# Patient Record
Sex: Female | Born: 1995
Health system: Southern US, Community
[De-identification: ages and names within clinical notes are randomized; demographics above are authoritative.]

## PROBLEM LIST (undated history)

## (undated) DIAGNOSIS — K649 Unspecified hemorrhoids: Secondary | ICD-10-CM

## (undated) DIAGNOSIS — F419 Anxiety disorder, unspecified: Secondary | ICD-10-CM

## (undated) DIAGNOSIS — R87610 Atypical squamous cells of undetermined significance on cytologic smear of cervix (ASC-US): Secondary | ICD-10-CM

## (undated) DIAGNOSIS — R87810 Cervical high risk human papillomavirus (HPV) DNA test positive: Secondary | ICD-10-CM

## (undated) DIAGNOSIS — E559 Vitamin D deficiency, unspecified: Secondary | ICD-10-CM

## (undated) HISTORY — DX: Unspecified hemorrhoids: K64.9

## (undated) HISTORY — PX: NO PAST SURGERIES: SHX2092

## (undated) HISTORY — DX: Anxiety disorder, unspecified: F41.9

## (undated) HISTORY — DX: Vitamin D deficiency, unspecified: E55.9

## (undated) HISTORY — DX: Cervical high risk human papillomavirus (HPV) DNA test positive: R87.810

## (undated) HISTORY — DX: Atypical squamous cells of undetermined significance on cytologic smear of cervix (ASC-US): R87.610

---
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
## 2016-11-28 DIAGNOSIS — R87610 Atypical squamous cells of undetermined significance on cytologic smear of cervix (ASC-US): Secondary | ICD-10-CM

## 2016-11-28 HISTORY — DX: Atypical squamous cells of undetermined significance on cytologic smear of cervix (ASC-US): R87.610

## 2017-11-22 ENCOUNTER — Ambulatory Visit
Admission: EM | Admit: 2017-11-22 | Discharge: 2017-11-22 | Disposition: A | Discharge status: Other Acute Inpatient Hospital | Payer: BLUE CROSS/BLUE SHIELD | Attending: Emergency Medicine | Admitting: Emergency Medicine

## 2017-11-22 ENCOUNTER — Encounter: Payer: Self-pay | Admitting: *Deleted

## 2017-11-22 ENCOUNTER — Other Ambulatory Visit: Payer: Self-pay

## 2017-11-22 DIAGNOSIS — R112 Nausea with vomiting, unspecified: Secondary | ICD-10-CM

## 2017-11-22 DIAGNOSIS — R1033 Periumbilical pain: Secondary | ICD-10-CM

## 2017-11-22 DIAGNOSIS — R197 Diarrhea, unspecified: Secondary | ICD-10-CM

## 2017-11-22 NOTE — ED Provider Notes (Signed)
HPI  SUBJECTIVE:  Kathy Romero is a 21 y.o. female who presents with nonmigratory, nonradiating, stabbing, intermittent, minutes long bilateral pelvic/lower abdominal pain for 3 days, anorexia, nausea, and watery diarrhea.  She states that she ate a raw hamburger on 12/23 and approximately 8-10 hours later, she started having multiple episodes of emesis.  She had an E visit, was prescribed Zofran which worked temporarily.  She was then seen in the [facility] ER 2 days ago for right lower quadrant pain starting earlier that day.  CBC, CMP, lipase, UA and pregnancy were all normal.  Abdomen was benign.  She declined a pelvic exam that day.  The ER held off on CT imaging on that visit, but advised patient to return for scan if she did not get better in the next 24 hours or if she got worse.  She states that the vomiting is slowed down and she is now tolerating fluids, but has started to have watery, nonbloody diarrhea.  She has not taken any antiemetics in the past 24 hours.  States that the pain has not gotten any worse, but it is not gotten any better.  She has tried Pepto-Bismol, Aleve.  Symptoms are slightly better after she has a bowel movement, worse with eating, movement, walking.  She states that the car ride over here was painful.  She denies fevers, abdominal distention, urinary complaints.  She reports decreased urine output.  No vaginal bleeding, discharge, odor, genital rash or itching.  She has not been sexually active in a month.  She states that she has a single relatively new female sexual  partner.  She reports occasional alcohol use.  Denies drinking heavily recently.  No antibiotics in the past month.  No antipyretic in the past 6-8 hours.  No recent travel, camping, pending disease, exposure to chickens, reptiles.  No past medical history of abdominal surgeries, diabetes, hypertension, pancreatitis, appendicitis, gallbladder disease, gonorrhea, chlamydia, HIV, HSV, syphilis, Trichomonas, BV, PID,  ovarian cyst.  LMP: 2 weeks ago.  She has a Nexplanon.  PMD: None.  History reviewed. No pertinent past medical history.  History reviewed. No pertinent surgical history.  History reviewed. No pertinent family history.  Social History   Tobacco Use  . Smoking status: Current Every Day Smoker    Packs/day: 0.50    Types: Cigarettes  . Smokeless tobacco: Never Used  Substance Use Topics  . Alcohol use: Yes    Frequency: Never  . Drug use: No    No current facility-administered medications for this encounter.   Current Outpatient Medications:  .  ondansetron (ZOFRAN) 4 MG tablet, Take 4 mg by mouth every 8 (eight) hours as needed for nausea or vomiting., Disp: , Rfl:   No Known Allergies   ROS  As noted in HPI.   Physical Exam  BP 120/70 (BP Location: Left Arm)   Pulse 91   Temp 98.6 F (37 C) (Oral)   Resp 16   Ht 5\' 2"  (1.575 m)   Wt 186 lb (84.4 kg)   SpO2 99%   BMI 34.02 kg/m   Constitutional: Well developed, well nourished, no acute distress Eyes:  EOMI, conjunctiva normal bilaterally HENT: Normocephalic, atraumatic,mucus membranes moist Respiratory: Normal inspiratory effort Cardiovascular: Normal rate GI: nondistended.  Normal appearance.  Soft, non-distended.  Positive periumbilical tenderness.  Negative Murphy, negative McBurney.  Active bowel sounds, no guarding, rebound.  Negative tap table test Back: No CVAT Skin: No rash, skin intact Musculoskeletal: no deformities Neurologic: Alert &  oriented x 3, no focal neuro deficits Psychiatric: Speech and behavior appropriate   ED Course   Medications - No data to display  No orders of the defined types were placed in this encounter.   No results found for this or any previous visit (from the past 24 hour(s)). No results found.  ED Clinical Impression  Periumbilical abdominal pain  Nausea vomiting and diarrhea   ED Assessment/Plan  Outside records reviewed as noted in HPI.  Given that  the patient's pain has not improved, still concern for appendicitis or other surgical abdomen.  In the differential is infectious colitis.  If this was simply gastroenteritis, I would expect the patient's pain to be getting better by now.  Doubt GYN cause of her pain as it is periumbilical on exam, atlhough this is in the differential.  Had extensive discussion with the patient that we could repeat the labs done in the ER, but feel that she requires imaging that is not available at this urgent care center.  Stool sample may also be beneficial.  Patient declined pain or nausea medicine prior to leaving.  Feel that she is stable to go by private vehicle.  Discussed with the charge nurse at [facility].    No orders of the defined types were placed in this encounter.   *This clinic note was created using Dragon dictation software. Therefore, there may be occasional mistakes despite careful proofreading.   ?   Domenick GongMortenson, Hridaan Bouse, MD 11/22/17 1352

## 2017-11-22 NOTE — ED Triage Notes (Signed)
PAtient started having severe nausea and vomiting 2 days ago after eating a hamburger that didn't taste right. [facility] vomiting. Patient continues to have symptoms of nausea and vomiting with diarrhea starting today.

## 2017-11-28 DIAGNOSIS — K649 Unspecified hemorrhoids: Secondary | ICD-10-CM

## 2017-11-28 HISTORY — DX: Unspecified hemorrhoids: K64.9

## 2017-12-18 ENCOUNTER — Encounter: Payer: Self-pay | Admitting: Obstetrics and Gynecology

## 2017-12-18 ENCOUNTER — Ambulatory Visit (INDEPENDENT_AMBULATORY_CARE_PROVIDER_SITE_OTHER): Payer: BLUE CROSS/BLUE SHIELD | Admitting: Obstetrics and Gynecology

## 2017-12-18 VITALS — BP 120/80 | Ht 62.0 in | Wt 172.0 lb

## 2017-12-18 DIAGNOSIS — Z3046 Encounter for surveillance of implantable subdermal contraceptive: Secondary | ICD-10-CM | POA: Diagnosis not present

## 2017-12-18 DIAGNOSIS — Z01419 Encounter for gynecological examination (general) (routine) without abnormal findings: Secondary | ICD-10-CM | POA: Diagnosis not present

## 2017-12-18 DIAGNOSIS — Z124 Encounter for screening for malignant neoplasm of cervix: Secondary | ICD-10-CM | POA: Diagnosis not present

## 2017-12-18 DIAGNOSIS — Z113 Encounter for screening for infections with a predominantly sexual mode of transmission: Secondary | ICD-10-CM | POA: Diagnosis not present

## 2017-12-18 NOTE — Progress Notes (Addendum)
PCP:  Patient, No Pcp Per   Chief Complaint  Patient presents with  . Gynecologic Exam     HPI:      Kathy Romero is a 22 y.o. No obstetric history on file. who LMP was Patient's last menstrual period was 12/15/2017., presents today for her NP annual examination.  Her menses are irregular with nexplanon. Dysmenorrhea none. She does not have intermenstrual bleeding.  Sex activity: single partner, contraception - nexplanon. Placed 04/2016. Hx of STDs: none  There is no FH of breast cancer. There is no FH of ovarian cancer. The patient does not do self-breast exams.  Tobacco use: The patient currently smokes 1/4 packs of cigarettes per day for the past few years. Thinking about quitting.  Alcohol use: none No drug use.  Exercise: moderately active  She does get adequate calcium and Vitamin D in her diet.  Gardasil not completed.    History reviewed. No pertinent past medical history.  History reviewed. No pertinent surgical history.  Family History  Problem Relation Age of Onset  . Hypertension Mother   . Diabetes Mother   . Hypertension Father   . Diabetes Father     Social History   Socioeconomic History  . Marital status: Single    Spouse name: Not on file  . Number of children: Not on file  . Years of education: Not on file  . Highest education level: Not on file  Social Needs  . Financial resource strain: Not on file  . Food insecurity - worry: Not on file  . Food insecurity - inability: Not on file  . Transportation needs - medical: Not on file  . Transportation needs - non-medical: Not on file  Occupational History  . Not on file  Tobacco Use  . Smoking status: Current Every Day Smoker    Packs/day: 0.50    Types: Cigarettes  . Smokeless tobacco: Never Used  Substance and Sexual Activity  . Alcohol use: Yes    Frequency: Never  . Drug use: No  . Sexual activity: Yes    Birth control/protection: Implant  Other Topics Concern  . Not  on file  Social History Narrative  . Not on file    No outpatient medications have been marked as taking for the 12/18/17 encounter (Office Visit) with Lanaiya Lantry B, PA-C.     ROS:  Review of Systems  Constitutional: Positive for fatigue. Negative for fever and unexpected weight change.  Respiratory: Negative for cough, shortness of breath and wheezing.   Cardiovascular: Negative for chest pain, palpitations and leg swelling.  Gastrointestinal: Negative for blood in stool, constipation, diarrhea, nausea and vomiting.  Endocrine: Negative for cold intolerance, heat intolerance and polyuria.  Genitourinary: Positive for vaginal discharge. Negative for dyspareunia, dysuria, flank pain, frequency, genital sores, hematuria, menstrual problem, pelvic pain, urgency, vaginal bleeding and vaginal pain.  Musculoskeletal: Positive for arthralgias. Negative for back pain, joint swelling and myalgias.  Skin: Negative for rash.  Neurological: Positive for headaches. Negative for dizziness, syncope, light-headedness and numbness.  Hematological: Negative for adenopathy.  Psychiatric/Behavioral: Negative for agitation, confusion, sleep disturbance and suicidal ideas. The patient is not nervous/anxious.      Objective: BP 120/80   Ht 5\' 2"  (1.575 m)   Wt 172 lb (78 kg)   LMP 12/15/2017   BMI 31.46 kg/m    Physical Exam  Constitutional: She is oriented to person, place, and time. She appears well-developed and well-nourished.  Genitourinary: Vagina normal and  uterus normal. There is no rash or tenderness on the right labia. There is no rash or tenderness on the left labia. No erythema or tenderness in the vagina. No vaginal discharge found. Right adnexum does not display mass and does not display tenderness. Left adnexum does not display mass and does not display tenderness. Cervix does not exhibit motion tenderness or polyp. Uterus is not enlarged or tender.  Neck: Normal range of motion. No  thyromegaly present.  Cardiovascular: Normal rate, regular rhythm and normal heart sounds.  No murmur heard. Pulmonary/Chest: Effort normal and breath sounds normal. Right breast exhibits no mass, no nipple discharge, no skin change and no tenderness. Left breast exhibits no mass, no nipple discharge, no skin change and no tenderness.  Abdominal: Soft. There is no tenderness. There is no guarding.  Musculoskeletal: Normal range of motion.  Neurological: She is alert and oriented to person, place, and time. No cranial nerve deficit.  Psychiatric: She has a normal mood and affect. Her behavior is normal.  Vitals reviewed.  Assessment/Plan: Encounter for annual routine gynecological examination  Cervical cancer screening - Plan: IGP,CtNgTv,rfx Aptima HPV ASCU  Screening for STD (sexually transmitted disease) - Plan: IGP,CtNgTv,rfx Aptima HPV ASCU  Encounter for surveillance of implantable subdermal contraceptive - Due for removal 04/2019.       GYN counsel adequate intake of calcium and vitamin D, diet and exercise, Gardasil vaccine (handout given), tobacco cessation     F/U  Return in about 1 year (around 12/18/2018).  Sherea Liptak B. Zaylia Riolo, PA-C 12/18/2017 2:11 PM

## 2017-12-18 NOTE — Patient Instructions (Signed)
I value your feedback and entrusting us with your care. If you get a  patient survey, I would appreciate you taking the time to let us know about your experience today. Thank you! 

## 2017-12-25 ENCOUNTER — Encounter: Payer: Self-pay | Admitting: Obstetrics and Gynecology

## 2017-12-25 LAB — IGP,CTNGTV,RFX APTIMA HPV ASCU
Chlamydia, Nuc. Acid Amp: NEGATIVE
Gonococcus, Nuc. Acid Amp: NEGATIVE
PAP Smear Comment: 0
Trich vag by NAA: NEGATIVE

## 2017-12-25 LAB — HPV APTIMA: HPV Aptima: POSITIVE — AB

## 2018-09-04 ENCOUNTER — Ambulatory Visit
Admission: EM | Admit: 2018-09-04 | Discharge: 2018-09-04 | Disposition: A | Discharge status: Home / Self Care | Payer: BLUE CROSS/BLUE SHIELD | Attending: Family Medicine | Admitting: Family Medicine

## 2018-09-04 ENCOUNTER — Encounter: Payer: Self-pay | Admitting: Emergency Medicine

## 2018-09-04 ENCOUNTER — Other Ambulatory Visit: Payer: Self-pay

## 2018-09-04 DIAGNOSIS — J069 Acute upper respiratory infection, unspecified: Secondary | ICD-10-CM

## 2018-09-04 MED ORDER — BENZONATATE 200 MG PO CAPS: ORAL_CAPSULE | ORAL | 0 refills | Status: DC

## 2018-09-04 MED ORDER — FLUTICASONE PROPIONATE 50 MCG/ACT NA SUSP: 2.0000 | Freq: Every day | NASAL | 0 refills | Status: DC

## 2018-09-04 NOTE — Discharge Instructions (Addendum)
Drink plenty of fluids rest as much as possible.  Gargle with warm salt water as we discussed. Hope you are feeling well soon

## 2018-09-04 NOTE — ED Provider Notes (Signed)
[facility]    CSN: [id number] Arrival date & time: 09/04/18  1226     History   Chief Complaint Chief Complaint  Patient presents with  . URI    HPI Kathy Romero is a 22 y.o. female.   HPI 22 year old female presents with 3-day history of sneezing coughing nasal congestion and sore throat.  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  Denies any fever or chills.  Tried over-the-counter remedies without success.       History reviewed. No pertinent past medical history.  There are no active problems to display for this patient.   Past Surgical History:  Procedure Laterality Date  . NO PAST SURGERIES      OB History    Gravida  0   Para  0   Term  0   Preterm  0   AB  0   Living  0     SAB  0   TAB  0   Ectopic  0   Multiple  0   Live Births  0            Home Medications    Prior to Admission medications   Medication Sig Start Date End Date Taking? Authorizing Provider  etonogestrel (IMPLANON) 68 MG IMPL implant Inject into the skin.   Yes [provider]  benzonatate (TESSALON) 200 MG capsule Take one cap TID PRN cough 09/04/18   Lutricia Feil, PA-C  fluticasone  [facility]) 50 MCG/ACT nasal spray Place 2 sprays into both nostrils daily. 09/04/18   Lutricia Feil, PA-C    Family History Family History  Problem Relation Age of Onset  . Hypertension Mother   . Diabetes Mother   . Hypertension Father   . Diabetes Father     Social History Social History   Tobacco Use  . Smoking status: Current Every Day Smoker    Packs/day: 0.50    Types: Cigarettes  . Smokeless tobacco: Never Used  Substance Use Topics  . Alcohol use: Yes    Frequency: Never  . Drug use: No     Allergies   Patient has no known allergies.   Review of Systems Review of Systems  Constitutional: Positive for activity change. Negative for appetite change, chills, fatigue and fever.  HENT:  Positive for sneezing and sore throat.   Respiratory: Positive for cough. Negative for shortness of breath.   All other systems reviewed and are negative.    Physical Exam Triage Vital Signs ED Triage Vitals  Enc Vitals Group     BP 09/04/18 1242 118/74     Pulse Rate 09/04/18 1242 65     Resp 09/04/18 1242 17     Temp 09/04/18 1242 98.8 F (37.1 C)     Temp Source 09/04/18 1242 Oral     SpO2 09/04/18 1242 100 %     Weight 09/04/18 1239 165 lb (74.8 kg)     Height 09/04/18 1239 5\' 2"  (1.575 m)     Head Circumference --      Peak Flow --      Pain Score 09/04/18 1239 5     Pain Loc --      Pain Edu? --      Excl. in GC? --    No data found.  Updated Vital Signs BP 118/74 (BP Location: Left Arm)   Pulse 65   Temp 98.8 F (37.1 C) (Oral)   Resp  17   Ht 5\' 2"  (1.575 m)   Wt 165 lb (74.8 kg)   SpO2 100%   BMI 30.18 kg/m   Visual Acuity Right Eye Distance:   Left Eye Distance:   Bilateral Distance:    Right Eye Near:   Left Eye Near:    Bilateral Near:     Physical Exam  Constitutional: She is oriented to person, place, and time. She appears well-developed and well-nourished. No distress.  HENT:  Head: Normocephalic.  Eyes: Pupils are equal, round, and reactive to light. Right eye exhibits no discharge. Left eye exhibits no discharge.  Neck: Normal range of motion.  Pulmonary/Chest: Effort normal and breath sounds normal.  Musculoskeletal: Normal range of motion.  Neurological: She is alert and oriented to person, place, and time.  Skin: Skin is warm and dry. She is not diaphoretic.  Psychiatric: She has a normal mood and affect. Her behavior is normal. Judgment and thought content normal.  Nursing note and vitals reviewed.    UC Treatments / Results  Labs (all labs ordered are listed, but only abnormal results are displayed) Labs Reviewed - No data to display  EKG None  Radiology No results found.  Procedures Procedures (including critical care  time)  Medications Ordered in UC Medications - No data to display  Initial Impression / Assessment and Plan / UC Course  I have reviewed the triage vital signs and the nursing notes.  Pertinent labs & imaging results that were available during my care of the patient were reviewed by me and considered in my medical decision making (see chart for details).     I have told patient she has an upper respiratory infection which is viral and does not require antibiotics.  Treat her symptomatically with Tessalon Perles for cough suppression and Flonase for nasal congestion.  Likely run its course.  She could also use Tylenol or Motrin for any body aches or fever.  If she is not improving recommend she follow-up with her primary care physician or she may return to our clinic Final Clinical Impressions(s) / UC Diagnoses   Final diagnoses:  Upper respiratory tract infection, unspecified type     Discharge Instructions     Drink plenty of fluids rest as much as possible.  Gargle with warm salt water as we discussed. Hope you are feeling well soon   ED Prescriptions    Medication Sig Dispense Auth. Provider   benzonatate (TESSALON) 200 MG capsule Take one cap TID PRN cough 30 capsule Ovid Curd P, PA-C   fluticasone (FLONASE) 50 MCG/ACT nasal spray Place 2 sprays into both nostrils daily. 16 g Lutricia Feil, PA-C     Controlled Substance Prescriptions [place] Controlled Substance Registry consulted? Not Applicable   Lutricia Feil, PA-C 09/04/18 1818

## 2018-09-04 NOTE — ED Triage Notes (Signed)
Pt c/o sneezing, cough, nasal congestion, and sore throat. Started about 3 days ago.

## 2018-10-11 ENCOUNTER — Encounter: Payer: Self-pay | Admitting: Obstetrics and Gynecology

## 2018-10-11 ENCOUNTER — Ambulatory Visit: Payer: BLUE CROSS/BLUE SHIELD | Admitting: Obstetrics and Gynecology

## 2018-10-11 ENCOUNTER — Ambulatory Visit (INDEPENDENT_AMBULATORY_CARE_PROVIDER_SITE_OTHER): Payer: BLUE CROSS/BLUE SHIELD | Admitting: Obstetrics and Gynecology

## 2018-10-11 ENCOUNTER — Other Ambulatory Visit (HOSPITAL_COMMUNITY)
Admission: RE | Admit: 2018-10-11 | Discharge: 2018-10-11 | Disposition: A | Discharge status: Home / Self Care | Payer: BLUE CROSS/BLUE SHIELD | Source: Ambulatory Visit | Attending: Obstetrics and Gynecology | Admitting: Obstetrics and Gynecology

## 2018-10-11 VITALS — BP 138/80 | HR 84 | Ht 62.0 in | Wt 188.0 lb

## 2018-10-11 DIAGNOSIS — N921 Excessive and frequent menstruation with irregular cycle: Secondary | ICD-10-CM

## 2018-10-11 DIAGNOSIS — Z975 Presence of (intrauterine) contraceptive device: Secondary | ICD-10-CM | POA: Diagnosis not present

## 2018-10-11 DIAGNOSIS — Z113 Encounter for screening for infections with a predominantly sexual mode of transmission: Secondary | ICD-10-CM | POA: Diagnosis present

## 2018-10-11 NOTE — Progress Notes (Signed)
Patient, No Pcp Per   Chief Complaint  Patient presents with  . STD screening    has had a good couple of periods this yr, where last one started 10/18 and ended earlier this week, abdominal pain    HPI:      [name] is a 22 y.o. G0P0000 who LMP was No LMP recorded. Patient has had an implant., presents today for STD testing. Just got out of a relationship. Having irreg bleeding/spotting with nexplanon. Pt wants to make sure STD not cause of sx. No vag sx, no belly pain, fevers.  Nexplanon placed 6/17.  Annual due 1/20.   History reviewed. No pertinent past medical history.  Past Surgical History:  Procedure Laterality Date  . NO PAST SURGERIES      Family History  Problem Relation Age of Onset  . Hypertension Mother   . Diabetes Mother   . Hypertension Father   . Diabetes Father     Social History   Socioeconomic History  . Marital status: Single    Spouse name: Not on file  . Number of children: Not on file  . Years of education: Not on file  . Highest education level: Not on file  Occupational History  . Not on file  Social Needs  . Financial resource strain: Not on file  . Food insecurity:    Worry: Not on file    Inability: Not on file  . Transportation needs:    Medical: Not on file    Non-medical: Not on file  Tobacco Use  . Smoking status: Current Every Day Smoker    Packs/day: 0.50    Types: Cigarettes  . Smokeless tobacco: Never Used  Substance and Sexual Activity  . Alcohol use: Yes    Frequency: Never  . Drug use: No  . Sexual activity: Not Currently    Birth control/protection: Implant  Lifestyle  . Physical activity:    Days per week: Not on file    Minutes per session: Not on file  . Stress: Not on file  Relationships  . Social connections:    Talks on phone: Not on file    Gets together: Not on file    Attends religious service: Not on file    Active member of club or organization: Not on file    Attends  meetings of clubs or organizations: Not on file    Relationship status: Not on file  . Intimate partner violence:    Fear of current or ex partner: Not on file    Emotionally abused: Not on file    Physically abused: Not on file    Forced sexual activity: Not on file  Other Topics Concern  . Not on file  Social History Narrative  . Not on file    Outpatient Medications Prior to Visit  Medication Sig Dispense Refill  . benzonatate (TESSALON) 200 MG capsule Take one cap TID PRN cough 30 capsule 0  . etonogestrel (IMPLANON) 68 MG IMPL implant Inject into the skin.    . fluticasone (FLONASE) 50 MCG/ACT nasal spray Place 2 sprays into both nostrils daily. 16 g 0  . Vitamin D, Ergocalciferol, (DRISDOL) 1.25 MG (50000 UT) CAPS capsule   0   No facility-administered medications prior to visit.     ROS:  Review of Systems  Constitutional: Negative for fever.  Gastrointestinal: Negative for blood in stool, constipation, diarrhea, nausea and vomiting.  Genitourinary: Positive for menstrual problem and vaginal  discharge. Negative for dyspareunia, dysuria, flank pain, frequency, hematuria, urgency, vaginal bleeding and vaginal pain.  Musculoskeletal: Negative for back pain.  Skin: Negative for rash.   BREAST: No symptoms   OBJECTIVE:   Vitals:  BP 138/80   Pulse 84   Ht 5\' 2"  (1.575 m)   Wt 188 lb (85.3 kg)   BMI 34.39 kg/m   Physical Exam  Constitutional: She is oriented to person, place, and time. Vital signs are normal. She appears well-developed.  Pulmonary/Chest: Effort normal.  Genitourinary: Vagina normal and uterus normal. There is no rash, tenderness or lesion on the right labia. There is no rash, tenderness or lesion on the left labia. Uterus is not enlarged and not tender. Cervix exhibits no motion tenderness. Right adnexum displays no mass and no tenderness. Left adnexum displays no mass and no tenderness. No erythema or tenderness in the vagina. No vaginal discharge  found.  Musculoskeletal: Normal range of motion.  Neurological: She is alert and oriented to person, place, and time.  Psychiatric: She has a normal mood and affect. Her behavior is normal. Thought content normal.  Vitals reviewed.    Assessment/Plan: Screening for STD (sexually transmitted disease) - Will call with results.  - Plan: Cervicovaginal ancillary only  Breakthrough bleeding on Nexplanon - Check STD testing. If neg, reassurance. F/u prn.    Return if symptoms worsen or fail to improve.  Daxon Kyne B. Obe Ahlers, PA-C 10/11/2018 3:51 PM

## 2018-10-11 NOTE — Patient Instructions (Signed)
I value your feedback and entrusting us with your care. If you get a [facility] patient survey, I would appreciate you taking the time to let us know about your experience today. Thank you! 

## 2018-10-15 LAB — CERVICOVAGINAL ANCILLARY ONLY
Chlamydia: NEGATIVE
Neisseria Gonorrhea: NEGATIVE
Trichomonas: NEGATIVE

## 2018-11-14 ENCOUNTER — Encounter: Payer: Self-pay | Admitting: Emergency Medicine

## 2018-11-14 ENCOUNTER — Other Ambulatory Visit: Payer: Self-pay

## 2018-11-14 ENCOUNTER — Ambulatory Visit
Admission: EM | Admit: 2018-11-14 | Discharge: 2018-11-14 | Disposition: A | Discharge status: Home / Self Care | Payer: BLUE CROSS/BLUE SHIELD | Attending: Family Medicine | Admitting: Family Medicine

## 2018-11-14 DIAGNOSIS — H109 Unspecified conjunctivitis: Secondary | ICD-10-CM

## 2018-11-14 DIAGNOSIS — J029 Acute pharyngitis, unspecified: Secondary | ICD-10-CM

## 2018-11-14 DIAGNOSIS — J020 Streptococcal pharyngitis: Secondary | ICD-10-CM | POA: Insufficient documentation

## 2018-11-14 LAB — RAPID STREP SCREEN (MED CTR MEBANE ONLY): Streptococcus, Group A Screen (Direct): POSITIVE — AB

## 2018-11-14 MED ORDER — MOXIFLOXACIN HCL 0.5 % OP SOLN: 1.0000 [drp] | Freq: Three times a day (TID) | OPHTHALMIC | 0 refills | Status: AC

## 2018-11-14 MED ORDER — FLUCONAZOLE 150 MG PO TABS: 150.0000 mg | ORAL_TABLET | Freq: Once | ORAL | 0 refills | Status: AC

## 2018-11-14 MED ORDER — AMOXICILLIN 500 MG PO CAPS: 500.0000 mg | ORAL_CAPSULE | Freq: Two times a day (BID) | ORAL | 0 refills | Status: AC

## 2018-11-14 NOTE — Discharge Instructions (Addendum)
Take medication as prescribed. Rest. Drink plenty of fluids. Good hand hygiene.  ° °Follow up with your primary care physician this week as needed. Return to Urgent care for new or worsening concerns.  ° °

## 2018-11-14 NOTE — ED Provider Notes (Signed)
[facility] ____________________________________________  Time seen: Approximately 8:41 AM  I have reviewed the triage vital signs and the nursing notes.   HISTORY  Chief Complaint Sore Throat (appt) and Eye Problem (left)   HPI Kathy Romero is a 22 y.o. female presenting for evaluation of sore throat for the last 3 days, and left eye redness for the last 2 days.  States sore throat is currently mild, and states that she has been intermittently gargling with peroxide which helps.  States she has some nasal congestion, but states that is consistent with her normal seasonal allergies.  Also reports left eye redness that she noticed yesterday, and states that she had some drainage from her eye this morning as well as crusting shut earlier this morning.  Denies any eye pain, foreign body, foreign body sensation, injury, trauma, chemical exposure, known sick contact.  Denies contact use.  Does wear glasses intermittently.  Denies any vision changes, photophobia.  Reports otherwise feeling well.  Denies other aggravating or alleviating factors.  Does work in a nursing facility.  Denies chest pain or shortness of breath.  Reports otherwise doing well.  No LMP recorded. Patient has had an implant. denies pregnancy.    History reviewed. No pertinent past medical history.  Patient Active Problem List   Diagnosis Date Noted  . Breakthrough bleeding on Nexplanon 10/11/2018    Past Surgical History:  Procedure Laterality Date  . NO PAST SURGERIES       No current facility-administered medications for this encounter.   Current Outpatient Medications:  .  etonogestrel (IMPLANON) 68 MG IMPL implant, Inject into the skin., Disp: , Rfl:  .  fluticasone (FLONASE) 50 MCG/ACT nasal spray, Place 2 sprays into both nostrils daily., Disp: 16 g, Rfl: 0 .  Vitamin D, Ergocalciferol, (DRISDOL) 1.25 MG (50000 UT) CAPS capsule, , Disp: , Rfl: 0 .  amoxicillin (AMOXIL) 500 MG capsule,  Take 1 capsule (500 mg total) by mouth 2 (two) times daily for 10 days., Disp: 20 capsule, Rfl: 0 .  fluconazole (DIFLUCAN) 150 MG tablet, Take 1 tablet (150 mg total) by mouth once for 1 dose. Take one pill orally, as needed, Disp: 1 tablet, Rfl: 0 .  moxifloxacin (VIGAMOX) 0.5 % ophthalmic solution, Place 1 drop into the left eye 3 (three) times daily for 7 days., Disp: 3 mL, Rfl: 0  Allergies Sulfamethoxazole-trimethoprim  Family History  Problem Relation Age of Onset  . Hypertension Mother   . Diabetes Mother   . Hypertension Father   . Diabetes Father     Social History Social History   Tobacco Use  . Smoking status: Current Every Day Smoker    Packs/day: 0.25    Types: Cigarettes  . Smokeless tobacco: Never Used  Substance Use Topics  . Alcohol use: Yes    Frequency: Never  . Drug use: No    Review of Systems Constitutional: No fever/chills Eyes: No visual changes. As above.  ENT: positive sore throat. Cardiovascular: Denies chest pain. Respiratory: Denies shortness of breath. Gastrointestinal: No abdominal pain.   Musculoskeletal: Negative for back pain. Skin: Negative for rash.   ____________________________________________   PHYSICAL EXAM:  VITAL SIGNS: ED Triage Vitals  Enc Vitals Group     BP 11/14/18 0829 138/90     Pulse Rate 11/14/18 0829 89     Resp 11/14/18 0829 18     Temp 11/14/18 0829 98.5 F (36.9 C)     Temp Source 11/14/18 0829 Oral  SpO2 11/14/18 0829 100 %     Weight 11/14/18 0826 180 lb (81.6 kg)     Height 11/14/18 0826 5\' 2"  (1.575 m)     Head Circumference --      Peak Flow --      Pain Score 11/14/18 0825 7     Pain Loc --      Pain Edu? --      Excl. in GC? --      Visual Acuity  Right Eye Distance: 20/20(corrected) Left Eye Distance: 20/20(corrected) Bilateral Distance: 20/20(corrected)  Constitutional: Alert and oriented. Well appearing and in no acute distress. Eyes: Left conjunctive mild diffuse injection.  No  right injection.  No foreign body visualized bilaterally.  Left eye with mild whitish-greenish drainage and crusting noted.  No surrounding tenderness, swelling or erythema.  Bilateral is nontender.  PERRL. EOMI. no pain with EOMs. Head: Atraumatic. No sinus tenderness to palpation. No swelling. No erythema.  Ears: no erythema, normal TMs bilaterally.   Nose: Minimal nasal congestion.  Mouth/Throat: Mucous membranes are moist. Mild pharyngeal erythema.  Mild tonsillar swelling.  No exudate. Neck: No stridor.  No cervical spine tenderness to palpation. Hematological/Lymphatic/Immunilogical: Mild anterior bilateral cervical lymphadenopathy. Cardiovascular: Normal rate, regular rhythm. Grossly normal heart sounds.  Good peripheral circulation. Respiratory: Normal respiratory effort.  No retractions. No wheezes, rales or rhonchi. Good air movement.  Musculoskeletal: Ambulatory with steady gait.  Neurologic:  Normal speech and language. No gait instability. Skin:  Skin appears warm, dry and intact. No rash noted. Psychiatric: Mood and affect are normal. Speech and behavior are normal.  ___________________________________________   LABS (all labs ordered are listed, but only abnormal results are displayed)  Labs Reviewed  RAPID STREP SCREEN ([facility] ONLY) - Abnormal; Notable for the following components:      Result Value   Streptococcus, Group A Screen (Direct) POSITIVE (*)    All other components within normal limits     PROCEDURES Procedures    INITIAL IMPRESSION / ASSESSMENT AND PLAN / ED COURSE  Pertinent labs & imaging results that were available during my care of the patient were reviewed by me and considered in my medical decision making (see chart for details).  Well-appearing patient.  No acute distress.  Strep positive.  Discussed treatment options with patient, will treat with oral amoxicillin.  Concern for bacterial conjunctivitis, will treat with Vigamox.  Encourage  supportive care, hand hygiene.  Work note given for today and tomorrow.Discussed indication, risks and benefits of medications with patient.  Discussed follow up with Primary care physician this week as needed. Discussed follow up and return parameters including no resolution or any worsening concerns. Patient verbalized understanding and agreed to plan.   ____________________________________________   FINAL CLINICAL IMPRESSION(S) / ED DIAGNOSES  Final diagnoses:  Streptococcal sore throat  Conjunctivitis of left eye, unspecified conjunctivitis type     ED Discharge Orders         Ordered    amoxicillin (AMOXIL) 500 MG capsule  2 times daily     11/14/18 0858    fluconazole (DIFLUCAN) 150 MG tablet   Once     11/14/18 0858    moxifloxacin (VIGAMOX) 0.5 % ophthalmic solution  3 times daily     11/14/18 5621           Note: This dictation was prepared with Dragon dictation along with smaller phrase technology. Any transcriptional errors that result from this process are unintentional.  Renford Dills, NP 11/14/18 [phone]

## 2018-11-14 NOTE — ED Triage Notes (Signed)
Pt c/o sore throat, and left eye redness. Sore throat started about 3 days ago. Then yesterday she noticed her left eye being red and this morning when she woke up it was matted shut and watering. No itching or pain. No fever, chills.

## 2019-01-01 ENCOUNTER — Ambulatory Visit (INDEPENDENT_AMBULATORY_CARE_PROVIDER_SITE_OTHER): Payer: Managed Care, Other (non HMO)

## 2019-01-01 ENCOUNTER — Ambulatory Visit
Admission: EM | Admit: 2019-01-01 | Discharge: 2019-01-01 | Disposition: A | Discharge status: Home / Self Care | Payer: Managed Care, Other (non HMO) | Attending: Family Medicine | Admitting: Family Medicine

## 2019-01-01 ENCOUNTER — Encounter: Payer: Self-pay | Admitting: Emergency Medicine

## 2019-01-01 ENCOUNTER — Other Ambulatory Visit: Payer: Self-pay

## 2019-01-01 DIAGNOSIS — M722 Plantar fascial fibromatosis: Secondary | ICD-10-CM

## 2019-01-01 MED ORDER — MELOXICAM 15 MG PO TABS: 15.0000 mg | ORAL_TABLET | Freq: Every day | ORAL | 0 refills | Status: DC

## 2019-01-01 NOTE — ED Provider Notes (Signed)
[facility]    CSN: [id number] Arrival date & time: 01/01/19  1647     History   Chief Complaint Chief Complaint  Patient presents with  . Leg Pain    APPT right    HPI Kathy Romero is a 23 y.o. female.   HPI  23 year old female presents with ankle and heel pain with burning into her foot for 4 days.  She has had no injury.  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  Not endorse a pain when first stepping on the foot but instead feels more discomfort as the day progresses.  No significant past medical history.  Using Tylenol and wrapping her foot and an Ace wrap.         History reviewed. No pertinent past medical history.  Patient Active Problem List   Diagnosis Date Noted  . Breakthrough bleeding on Nexplanon 10/11/2018    Past Surgical History:  Procedure Laterality Date  . NO PAST SURGERIES      OB History    Gravida  0   Para  0   Term  0   Preterm  0   AB  0   Living  0     SAB  0   TAB  0   Ectopic  0   Multiple  0   Live Births  0            Home Medications    Prior to Admission medications   Medication Sig Start Date End Date Taking? Authorizing Provider  etonogestrel (IMPLANON) 68 MG IMPL implant Inject into the skin.   Yes [provider]  fluticasone (FLONASE) 50 MCG/ACT nasal spray Place 2 sprays into both nostrils daily. 09/04/18  Yes Lutricia Feil, PA-C  VITAMIN D, CHOLECALCIFEROL, PO Take by mouth.   Yes [provider]  amoxicillin (AMOXIL) 500 MG capsule Take 1 capsule by mouth every 8 (eight) hours. 12/31/18   [provider]  meloxicam (MOBIC) 15 MG tablet Take 1 tablet (15 mg total) by mouth daily. 01/01/19   Lutricia Feil, PA-C  Vitamin D, Ergocalciferol, (DRISDOL) 1.25 MG (50000 UT) CAPS capsule  08/30/18   [provider]    Family History Family History  Problem Relation Age of Onset  . Hypertension Mother   . Diabetes Mother   .  Hypertension Father   . Diabetes Father     Social History Social History   Tobacco Use  . Smoking status: Current Every Day Smoker    Packs/day: 0.25    Years: 3.00    Pack years: 0.75    Types: Cigarettes  . Smokeless tobacco: Never Used  Substance Use Topics  . Alcohol use: Yes    Frequency: Never    Comment: rarely  . Drug use: No     Allergies   Sulfamethoxazole-trimethoprim   Review of Systems Review of Systems  Constitutional: Positive for activity change. Negative for appetite change, chills and fatigue.  Musculoskeletal: Positive for gait problem and myalgias.  All other systems reviewed and are negative.    Physical Exam Triage Vital Signs ED Triage Vitals  Enc Vitals Group     BP 01/01/19 1712 125/90     Pulse Rate 01/01/19 1712 67     Resp 01/01/19 1712 16     Temp 01/01/19 1712 98.5 F (36.9 C)     Temp Source 01/01/19 1712 Oral     SpO2 01/01/19  1712 100 %     Weight 01/01/19 1711 180 lb (81.6 kg)     Height 01/01/19 1711 5\' 2"  (1.575 m)     Head Circumference --      Peak Flow --      Pain Score 01/01/19 1711 7     Pain Loc --      Pain Edu? --      Excl. in GC? --    No data found.  Updated Vital Signs BP 125/90 (BP Location: Left Arm)   Pulse 67   Temp 98.5 F (36.9 C) (Oral)   Resp 16   Ht 5\' 2"  (1.575 m)   Wt 180 lb (81.6 kg)   SpO2 100%   BMI 32.92 kg/m   Visual Acuity Right Eye Distance:   Left Eye Distance:   Bilateral Distance:    Right Eye Near:   Left Eye Near:    Bilateral Near:     Physical Exam   UC Treatments / Results  Labs (all labs ordered are listed, but only abnormal results are displayed) Labs Reviewed - No data to display  EKG None  Radiology Dg Foot Complete Right  Result Date: 01/01/2019 CLINICAL DATA:  Right heel pain for the past 4 days. EXAM: RIGHT FOOT COMPLETE - 3+ VIEW COMPARISON:  None. FINDINGS: There is no evidence of fracture or dislocation. There is no evidence of arthropathy or  other focal bone abnormality. Soft tissues are unremarkable. IMPRESSION: Normal examination. Electronically Signed   By: Beckie Salts M.D.   On: 01/01/2019 18:54    Procedures Procedures (including critical care time)  Medications Ordered in UC Medications - No data to display  Initial Impression / Assessment and Plan / UC Course  I have reviewed the triage vital signs and the nursing notes.  Pertinent labs & imaging results that were available during my care of the patient were reviewed by me and considered in my medical decision making (see chart for details).   Reviewed x-rays with the patient.  Reviewed basic exercises for stretching that she can perform several times daily.  We will provide her with anti-inflammatory medications.  Purchase Silastic heel lifts for her shoe.  If not improving she should follow-up with podiatry.   Final Clinical Impressions(s) / UC Diagnoses   Final diagnoses:  Plantar fasciitis of right foot     Discharge Instructions     Purchase Silastic heel lifts placed in all shoes.  Research [employer] for exercises specific to plantar fasciitis.  Not improving follow-up with podiatrist    ED Prescriptions    Medication Sig Dispense Auth. Provider   meloxicam (MOBIC) 15 MG tablet Take 1 tablet (15 mg total) by mouth daily. 30 tablet Lutricia Feil, PA-C     Controlled Substance Prescriptions [place] Controlled Substance Registry consulted? Not Applicable   Joesph July 01/01/19 4628

## 2019-01-01 NOTE — ED Triage Notes (Signed)
Patient in today c/o right lower leg pain and right foot burning x 4 days. No injury noted.

## 2019-01-01 NOTE — Discharge Instructions (Addendum)
Purchase Silastic heel lifts placed in all shoes.  Research [employer] for exercises specific to plantar fasciitis.  Not improving follow-up with podiatrist

## 2019-01-08 ENCOUNTER — Other Ambulatory Visit (HOSPITAL_COMMUNITY)
Admission: RE | Admit: 2019-01-08 | Discharge: 2019-01-08 | Disposition: A | Discharge status: Home / Self Care | Payer: Managed Care, Other (non HMO) | Source: Ambulatory Visit | Attending: Obstetrics and Gynecology | Admitting: Obstetrics and Gynecology

## 2019-01-08 ENCOUNTER — Encounter: Payer: Self-pay | Admitting: Obstetrics and Gynecology

## 2019-01-08 ENCOUNTER — Ambulatory Visit (INDEPENDENT_AMBULATORY_CARE_PROVIDER_SITE_OTHER): Payer: Managed Care, Other (non HMO) | Admitting: Obstetrics and Gynecology

## 2019-01-08 VITALS — BP 128/84 | HR 71 | Ht 62.0 in | Wt 193.0 lb

## 2019-01-08 DIAGNOSIS — Z124 Encounter for screening for malignant neoplasm of cervix: Secondary | ICD-10-CM | POA: Insufficient documentation

## 2019-01-08 DIAGNOSIS — R87610 Atypical squamous cells of undetermined significance on cytologic smear of cervix (ASC-US): Secondary | ICD-10-CM | POA: Insufficient documentation

## 2019-01-08 DIAGNOSIS — R87810 Cervical high risk human papillomavirus (HPV) DNA test positive: Secondary | ICD-10-CM | POA: Diagnosis present

## 2019-01-08 DIAGNOSIS — Z113 Encounter for screening for infections with a predominantly sexual mode of transmission: Secondary | ICD-10-CM | POA: Diagnosis present

## 2019-01-08 DIAGNOSIS — Z01419 Encounter for gynecological examination (general) (routine) without abnormal findings: Secondary | ICD-10-CM | POA: Diagnosis not present

## 2019-01-08 DIAGNOSIS — Z1151 Encounter for screening for human papillomavirus (HPV): Secondary | ICD-10-CM | POA: Insufficient documentation

## 2019-01-08 DIAGNOSIS — Z3046 Encounter for surveillance of implantable subdermal contraceptive: Secondary | ICD-10-CM

## 2019-01-08 NOTE — Progress Notes (Signed)
PCP:  [facility]   Chief Complaint  Patient presents with  . Gynecologic Exam     HPI:      [name] is a 23 y.o. No obstetric history on file. who LMP was No LMP recorded. Patient has had an implant., presents today for her annual examination.  Her menses are irregular with nexplanon. Dysmenorrhea none.   Sex activity: single partner, contraception - nexplanon. Placed 04/2016. Due for removal this yr and pt wants a replacement.  Last pap: 12/18/17 Results: ASCUS with POS HPV DNA, repeat due today. Hx of STDs: HPV  There is no FH of breast cancer. There is no FH of ovarian cancer. The patient does not do self-breast exams.  Tobacco use: The patient currently smokes 1/4 packs of cigarettes per day for the past few years. Thinking about quitting.  Alcohol use: social No drug use.  Exercise: moderately active  She does get adequate calcium and Vitamin D in her diet.  Gardasil not completed.    Past Medical History:  Diagnosis Date  . ASCUS with positive high risk HPV cervical 2018  . Vitamin D deficiency     Past Surgical History:  Procedure Laterality Date  . NO PAST SURGERIES      Family History  Problem Relation Age of Onset  . Hypertension Mother   . Diabetes Mother   . Hypertension Father   . Diabetes Father     Social History   Socioeconomic History  . Marital status: Single    Spouse name: Not on file  . Number of children: Not on file  . Years of education: Not on file  . Highest education level: Not on file  Occupational History  . Not on file  Social Needs  . Financial resource strain: Not on file  . Food insecurity:    Worry: Not on file    Inability: Not on file  . Transportation needs:    Medical: Not on file    Non-medical: Not on file  Tobacco Use  . Smoking status: Current Every Day Smoker    Packs/day: 0.25    Years: 3.00    Pack years: 0.75    Types: Cigarettes  . Smokeless tobacco: Never Used    Substance and Sexual Activity  . Alcohol use: Yes    Frequency: Never    Comment: rarely  . Drug use: No  . Sexual activity: Yes    Birth control/protection: Implant  Lifestyle  . Physical activity:    Days per week: Not on file    Minutes per session: Not on file  . Stress: Not on file  Relationships  . Social connections:    Talks on phone: Not on file    Gets together: Not on file    Attends religious service: Not on file    Active member of club or organization: Not on file    Attends meetings of clubs or organizations: Not on file    Relationship status: Not on file  . Intimate partner violence:    Fear of current or ex partner: Not on file    Emotionally abused: Not on file    Physically abused: Not on file    Forced sexual activity: Not on file  Other Topics Concern  . Not on file  Social History Narrative  . Not on file    Current Meds  Medication Sig  . amoxicillin (AMOXIL) 500 MG capsule Take 1 capsule by mouth  every 8 (eight) hours.  Marland Kitchen etonogestrel (IMPLANON) 68 MG IMPL implant Inject into the skin.  . fluticasone (FLONASE) 50 MCG/ACT nasal spray Place 2 sprays into both nostrils daily.  . meloxicam (MOBIC) 15 MG tablet Take 1 tablet (15 mg total) by mouth daily.  Marland Kitchen VITAMIN D, CHOLECALCIFEROL, PO Take by mouth.  . Vitamin D, Ergocalciferol, (DRISDOL) 1.25 MG (50000 UT) CAPS capsule      ROS:  Review of Systems  Constitutional: Positive for fatigue. Negative for fever and unexpected weight change.  Respiratory: Negative for cough, shortness of breath and wheezing.   Cardiovascular: Negative for chest pain, palpitations and leg swelling.  Gastrointestinal: Negative for blood in stool, constipation, diarrhea, nausea and vomiting.  Endocrine: Negative for cold intolerance, heat intolerance and polyuria.  Genitourinary: Positive for vaginal discharge. Negative for dyspareunia, dysuria, flank pain, frequency, genital sores, hematuria, menstrual problem, pelvic  pain, urgency, vaginal bleeding and vaginal pain.  Musculoskeletal: Positive for arthralgias. Negative for back pain, joint swelling and myalgias.  Skin: Negative for rash.  Neurological: Positive for headaches. Negative for dizziness, syncope, light-headedness and numbness.  Hematological: Negative for adenopathy.  Psychiatric/Behavioral: Negative for agitation, confusion, sleep disturbance and suicidal ideas. The patient is not nervous/anxious.      Objective: BP 128/84   Pulse 71   Ht 5\' 2"  (1.575 m)   Wt 193 lb (87.5 kg)   BMI 35.30 kg/m    Physical Exam Constitutional:      Appearance: She is well-developed.  Genitourinary:     Vagina and uterus normal.     No vaginal discharge, erythema or tenderness.     No cervical motion tenderness or polyp.     Uterus is not enlarged or tender.     No right or left adnexal mass present.     Right adnexa not tender.     Left adnexa not tender.  Neck:     Musculoskeletal: Normal range of motion.     Thyroid: No thyromegaly.  Cardiovascular:     Rate and Rhythm: Normal rate and regular rhythm.     Heart sounds: Normal heart sounds. No murmur.  Pulmonary:     Effort: Pulmonary effort is normal.     Breath sounds: Normal breath sounds.  Chest:     Breasts:        Right: No mass, nipple discharge, skin change or tenderness.        Left: No mass, nipple discharge, skin change or tenderness.  Abdominal:     Palpations: Abdomen is soft.     Tenderness: There is no abdominal tenderness. There is no guarding.  Musculoskeletal: Normal range of motion.  Neurological:     Mental Status: She is alert and oriented to person, place, and time.     Cranial Nerves: No cranial nerve deficit.  Psychiatric:        Behavior: Behavior normal.  Vitals signs reviewed.    Assessment/Plan: Encounter for annual routine gynecological examination  Cervical cancer screening - Plan: Cytology - PAP  Screening for STD (sexually transmitted disease) -  Plan: Cytology - PAP  Screening for HPV (human papillomavirus) - Plan: Cytology - PAP  ASCUS with positive high risk HPV cervical - Repeat pap today. Will call with results and disposition. - Plan: Cytology - PAP  Encounter for surveillance of implantable subdermal contraceptive - Pt will want replacement 04/2019 and will call 5/20 to sched.       GYN counsel adequate intake of calcium and vitamin  D, diet and exercise, tobacco cessation     F/U  Return in about 1 year (around 01/09/2020).  Harvis Mabus B. Saim Almanza, PA-C 01/08/2019 1:46 PM

## 2019-01-08 NOTE — Patient Instructions (Signed)
I value your feedback and entrusting us with your care. If you get a [facility] patient survey, I would appreciate you taking the time to let us know about your experience today. Thank you! 

## 2019-01-10 LAB — CYTOLOGY - PAP
Chlamydia: NEGATIVE
Diagnosis: NEGATIVE
HPV: NOT DETECTED
Neisseria Gonorrhea: NEGATIVE

## 2019-01-10 NOTE — Progress Notes (Signed)
Pls let pt know know pap neg. Will repeat next yr

## 2019-01-14 NOTE — Progress Notes (Signed)
Pt needs appt. Did she not have any sx when I saw her for her annual? Thx

## 2019-01-14 NOTE — Progress Notes (Signed)
Called and lvmtrc.

## 2019-01-30 ENCOUNTER — Ambulatory Visit: Payer: BLUE CROSS/BLUE SHIELD | Admitting: Obstetrics and Gynecology

## 2019-02-18 LAB — HM HIV SCREENING LAB: HM HIV Screening: NEGATIVE

## 2019-03-22 ENCOUNTER — Ambulatory Visit
Admission: EM | Admit: 2019-03-22 | Discharge: 2019-03-22 | Disposition: A | Discharge status: Home / Self Care | Payer: Managed Care, Other (non HMO) | Attending: Family Medicine | Admitting: Family Medicine

## 2019-03-22 ENCOUNTER — Other Ambulatory Visit: Payer: Self-pay

## 2019-03-22 DIAGNOSIS — N898 Other specified noninflammatory disorders of vagina: Secondary | ICD-10-CM

## 2019-03-22 LAB — URINALYSIS, COMPLETE (UACMP) WITH MICROSCOPIC
Bilirubin Urine: NEGATIVE
Glucose, UA: NEGATIVE mg/dL
Hgb urine dipstick: NEGATIVE
Ketones, ur: NEGATIVE mg/dL
Nitrite: NEGATIVE
Protein, ur: NEGATIVE mg/dL
Specific Gravity, Urine: 1.02 (ref 1.005–1.030)
pH: 7 (ref 5.0–8.0)

## 2019-03-22 LAB — WET PREP, GENITAL
Clue Cells Wet Prep HPF POC: NONE SEEN
Sperm: NONE SEEN
Trich, Wet Prep: NONE SEEN
Yeast Wet Prep HPF POC: NONE SEEN

## 2019-03-22 NOTE — Discharge Instructions (Signed)
No evidence of UTI.  Take last dose of diflucan.  If symptoms persist, I would recommend and exam and STD testing.  Take care  Dr. Adriana Simas

## 2019-03-22 NOTE — ED Triage Notes (Signed)
Patient states that she has been noticing burning with urination and discomfort after wiping. States that she took one dose of diflucan on Tuesday and is due to take her second one today but wanted to see if was a yeast infection vs UTI.

## 2019-03-22 NOTE — ED Provider Notes (Signed)
[facility]    CSN: [id number] Arrival date & time: 03/22/19  [id number]  History   Chief Complaint Chief Complaint  Patient presents with  . Vaginitis   HPI  23 year old female presents with concerns for vaginal yeast infection.  Patient states that earlier this week she noted itching and burning at the entrance of her vagina.  Patient reports that she has vaginal discharge as well.  Denies dysuria.  She does note urinary frequency.  No abdominal pain.  She has taken 1 dose of Diflucan without resolution.  She is due for second dose today.  Patient desires confirmation for source (vaginitis versus UTI).  No fever.  No other associated symptoms.  Patient does note that it is possible that she has an STD but she is not very concerned.  No other associated symptoms.  No other complaints.  PMH, Surgical Hx, Family Hx, Social History reviewed and updated as below.  Past Medical History:  Diagnosis Date  . ASCUS with positive high risk HPV cervical 2018  . Vitamin D deficiency     Patient Active Problem List   Diagnosis Date Noted  . ASCUS with positive high risk HPV cervical 01/08/2019  . Breakthrough bleeding on Nexplanon 10/11/2018    Past Surgical History:  Procedure Laterality Date  . NO PAST SURGERIES      OB History    Gravida  0   Para  0   Term  0   Preterm  0   AB  0   Living  0     SAB  0   TAB  0   Ectopic  0   Multiple  0   Live Births  0            Home Medications    Prior to Admission medications   Medication Sig Start Date End Date Taking? Authorizing Provider  etonogestrel (IMPLANON) 68 MG IMPL implant Inject into the skin.   Yes [provider]  fluconazole (DIFLUCAN) 150 MG tablet  03/20/19  Yes [provider]  fluticasone (FLONASE) 50 MCG/ACT nasal spray Place 2 sprays into both nostrils daily. 09/04/18  Yes Lutricia Feiloemer, William P, PA-C  meloxicam (MOBIC) 15 MG tablet Take 1 tablet (15 mg total) by mouth  daily. 01/01/19  Yes Lutricia Feiloemer, William P, PA-C  VITAMIN D, CHOLECALCIFEROL, PO Take by mouth.   Yes [provider]  Vitamin D, Ergocalciferol, (DRISDOL) 1.25 MG (50000 UT) CAPS capsule  08/30/18   [provider]    Family History Family History  Problem Relation Age of Onset  . Hypertension Mother   . Diabetes Mother   . Hypertension Father   . Diabetes Father   . Colon cancer Other     Social History Social History   Tobacco Use  . Smoking status: Current Every Day Smoker    Packs/day: 0.25    Years: 3.00    Pack years: 0.75    Types: Cigarettes  . Smokeless tobacco: Never Used  Substance Use Topics  . Alcohol use: Yes    Frequency: Never    Comment: rarely  . Drug use: No     Allergies   Sulfamethoxazole-trimethoprim   Review of Systems Review of Systems  Constitutional: Negative for fever.  Genitourinary: Positive for frequency and vaginal discharge.   Physical Exam Triage Vital Signs ED Triage Vitals  Enc Vitals Group     BP 03/22/19 0831 119/79     Pulse Rate 03/22/19 0831 85  Resp 03/22/19 0831 17     Temp 03/22/19 0831 99 F (37.2 C)     Temp Source 03/22/19 0831 Oral     SpO2 03/22/19 0831 99 %     Weight 03/22/19 0829 194 lb (88 kg)     Height 03/22/19 0829  (1.575 m)     Head Circumference --      Peak Flow --      Pain Score 03/22/19 0829 4     Pain Loc --      Pain Edu? --      Excl. in GC? --    Updated Vital Signs BP 119/79 (BP Location: Left Arm)   Pulse 85   Temp 99 F (37.2 C) (Oral)   Resp 17   Ht  (1.575 m)   Wt 88 kg   SpO2 99%   BMI 35.48 kg/m   Visual Acuity Right Eye Distance:   Left Eye Distance:   Bilateral Distance:    Right Eye Near:   Left Eye Near:    Bilateral Near:     Physical Exam Vitals signs and nursing note reviewed.  Constitutional:      General: She is not in acute distress.    Appearance: Normal appearance.  HENT:     Head: Normocephalic and atraumatic.  Eyes:      General:        Right eye: No discharge.        Left eye: No discharge.     Conjunctiva/sclera: Conjunctivae normal.  Cardiovascular:     Rate and Rhythm: Normal rate and regular rhythm.  Pulmonary:     Effort: Pulmonary effort is normal.     Breath sounds: Normal breath sounds.  Abdominal:     General: There is no distension.     Palpations: Abdomen is soft.     Tenderness: There is no abdominal tenderness.  Neurological:     Mental Status: She is alert.  Psychiatric:        Mood and Affect: Mood normal.        Behavior: Behavior normal.    UC Treatments / Results  Labs (all labs ordered are listed, but only abnormal results are displayed) Labs Reviewed  WET PREP, GENITAL - Abnormal; Notable for the following components:      Result Value   WBC, Wet Prep HPF POC MODERATE (*)    All other components within normal limits  URINALYSIS, COMPLETE (UACMP) WITH MICROSCOPIC - Abnormal; Notable for the following components:   APPearance CLOUDY (*)    Leukocytes,Ua SMALL (*)    Bacteria, UA MANY (*)    All other components within normal limits    EKG None  Radiology No results found.  Procedures Procedures (including critical care time)  Medications Ordered in UC Medications - No data to display  Initial Impression / Assessment and Plan / UC Course  I have reviewed the triage vital signs and the nursing notes.  Pertinent labs & imaging results that were available during my care of the patient were reviewed by me and considered in my medical decision making (see chart for details).    23 year old female presents with vaginal discharge.  Urinalysis not consistent with UTI.  She gave a quite contaminated sample.  Wet prep was negative.  Advised to take last dose of Diflucan.  Supportive care.  Final Clinical Impressions(s) / UC Diagnoses   Final diagnoses:  Vaginal discharge     Discharge Instructions  No evidence of UTI.  Take last dose of diflucan.  If  symptoms persist, I would recommend and exam and STD testing.  Take care  Dr. Adriana Simas     ED Prescriptions    None     Controlled Substance Prescriptions [place] Controlled Substance Registry consulted? Not Applicable   Tommie Sams, [place] 03/22/19 [phone]

## 2019-03-25 ENCOUNTER — Telehealth: Payer: Self-pay

## 2019-03-25 NOTE — Telephone Encounter (Signed)
Can you call pt and let her know, hopefully she will want appt and you have access to scheduling. Thank you.

## 2019-03-25 NOTE — Telephone Encounter (Signed)
Pt needs appt or can follow along for a week or so.

## 2019-03-25 NOTE — Telephone Encounter (Signed)
Pt is scheduled next wed

## 2019-03-25 NOTE — Telephone Encounter (Signed)
Pt called triage stating she went to the urgent care on Friday complaining of a poss yeast infection or BV. Pt stated she was checked for everything and it all came back negative however she is still having pain with intercourse and pain after intercourse. Please advise, thank you!

## 2019-03-25 NOTE — Telephone Encounter (Signed)
Is the pain vaginal or deep inside? Any extra discharge, odor?

## 2019-03-25 NOTE — Telephone Encounter (Signed)
Deep inside, no discharge or odor. Pain started last Wednesday. No pelvic pain.

## 2019-04-03 ENCOUNTER — Encounter: Payer: Self-pay | Admitting: Obstetrics and Gynecology

## 2019-04-03 ENCOUNTER — Telehealth: Payer: Self-pay | Admitting: Obstetrics and Gynecology

## 2019-04-03 ENCOUNTER — Ambulatory Visit (INDEPENDENT_AMBULATORY_CARE_PROVIDER_SITE_OTHER): Payer: Managed Care, Other (non HMO) | Admitting: Obstetrics and Gynecology

## 2019-04-03 ENCOUNTER — Other Ambulatory Visit: Payer: Self-pay

## 2019-04-03 VITALS — BP 110/70 | Ht 62.0 in | Wt 196.0 lb

## 2019-04-03 DIAGNOSIS — N949 Unspecified condition associated with female genital organs and menstrual cycle: Secondary | ICD-10-CM | POA: Diagnosis not present

## 2019-04-03 LAB — POCT WET PREP WITH KOH
Clue Cells Wet Prep HPF POC: NEGATIVE
KOH Prep POC: NEGATIVE
Trichomonas, UA: NEGATIVE
Yeast Wet Prep HPF POC: NEGATIVE

## 2019-04-03 NOTE — Patient Instructions (Signed)
I value your feedback and entrusting us with your care. If you get a  patient survey, I would appreciate you taking the time to let us know about your experience today. Thank you! 

## 2019-04-03 NOTE — Progress Notes (Signed)
[place]   Chief Complaint  Patient presents with  . Vaginal Pain    pt says after intercourse when she urinates she has burning sensation as if she had a cut x 2 weeks ago    HPI:      Ms. KYNSLEY TARVIN is a 23 y.o. G0P0000 who LMP was No LMP recorded. Patient has had an implant., presents today for vaginal burning/dysuria immediately after sex with urination. Sx then resolve spontaneously. Sx started a few wks ago with what felt like vaginal cut during sex. Pt treated with diflucan and then went to urgent care to rule out yeast vag/BV. Had neg wet prep. Pt took 2nd diflucan and sx resolved. All that remains is current sx. Pt uses dove sens skin soap, no dryer sheets, no condom use. Pt denies any new sex partners, dyspareunia, postcoital bleeding, pelvic pain. No other urin sx.  Pt has random bleeding with nexplanon, due to be exchanged next month.    Past Medical History:  Diagnosis Date  . ASCUS with positive high risk HPV cervical 2018  . Hemorrhoids 2019  . Vitamin D deficiency     Past Surgical History:  Procedure Laterality Date  . NO PAST SURGERIES      Family History  Problem Relation Age of Onset  . Hypertension Mother   . Diabetes Mother   . Hypertension Father   . Diabetes Father   . Colon cancer Other     Social History   Socioeconomic History  . Marital status: Single    Spouse name: Not on file  . Number of children: Not on file  . Years of education: Not on file  . Highest education level: Not on file  Occupational History  . Not on file  Social Needs  . Financial resource strain: Not on file  . Food insecurity:    Worry: Not on file    Inability: Not on file  . Transportation needs:    Medical: Not on file    Non-medical: Not on file  Tobacco Use  . Smoking status: Current Every Day Smoker    Packs/day: 0.25    Years: 3.00    Pack years: 0.75    Types: Cigarettes  . Smokeless tobacco: Never Used  Substance and  Sexual Activity  . Alcohol use: Yes    Frequency: Never    Comment: rarely  . Drug use: No  . Sexual activity: Yes    Birth control/protection: Implant  Lifestyle  . Physical activity:    Days per week: Not on file    Minutes per session: Not on file  . Stress: Not on file  Relationships  . Social connections:    Talks on phone: Not on file    Gets together: Not on file    Attends religious service: Not on file    Active member of club or organization: Not on file    Attends meetings of clubs or organizations: Not on file    Relationship status: Not on file  . Intimate partner violence:    Fear of current or ex partner: Not on file    Emotionally abused: Not on file    Physically abused: Not on file    Forced sexual activity: Not on file  Other Topics Concern  . Not on file  Social History Narrative  . Not on file     Current Outpatient Medications:  .  clonazePAM (KLONOPIN) 1 MG tablet, 1/2  to 1 po q 12 hours prn, Disp: , Rfl:  .  etonogestrel (IMPLANON) 68 MG IMPL implant, Inject into the skin., Disp: , Rfl:  .  fluticasone (FLONASE) 50 MCG/ACT nasal spray, Place 2 sprays into both nostrils daily., Disp: 16 g, Rfl: 0 .  meloxicam (MOBIC) 15 MG tablet, Take 1 tablet (15 mg total) by mouth daily., Disp: 30 tablet, Rfl: 0 .  VITAMIN D, CHOLECALCIFEROL, PO, Take by mouth., Disp: , Rfl:    ROS:  Review of Systems  Constitutional: Negative for fever.  Gastrointestinal: Positive for constipation. Negative for blood in stool, diarrhea, nausea and vomiting.  Genitourinary: Positive for dysuria and vaginal pain. Negative for dyspareunia, flank pain, frequency, hematuria, urgency, vaginal bleeding and vaginal discharge.  Musculoskeletal: Negative for back pain.  Skin: Negative for rash.    OBJECTIVE:   Vitals:  BP 110/70   Ht 5\' 2"  (1.575 m)   Wt 196 lb (88.9 kg)   BMI 35.85 kg/m   Physical Exam Vitals signs reviewed.  Constitutional:      Appearance: She is  well-developed.  Neck:     Musculoskeletal: Normal range of motion.  Pulmonary:     Effort: Pulmonary effort is normal.  Genitourinary:    General: Normal vulva.     Pubic Area: No rash.      Labia:        Right: No rash, tenderness or lesion.        Left: No rash, tenderness or lesion.      Vagina: Normal. No vaginal discharge, erythema or tenderness.     Cervix: Normal.     Uterus: Normal. Not enlarged and not tender.      Adnexa: Right adnexa normal and left adnexa normal.       Right: No mass or tenderness.         Left: No mass or tenderness.    Musculoskeletal: Normal range of motion.  Skin:    General: Skin is warm and dry.  Neurological:     General: No focal deficit present.     Mental Status: She is alert and oriented to person, place, and time.  Psychiatric:        Mood and Affect: Mood normal.        Behavior: Behavior normal.        Thought Content: Thought content normal.        Judgment: Judgment normal.     Results: Results for orders placed or performed in visit on 04/03/19 (from the past 24 hour(s))  POCT Wet Prep with KOH     Status: Normal   Collection Time: 04/03/19  8:54 AM  Result Value Ref Range   Trichomonas, UA Negative    Clue Cells Wet Prep HPF POC neg    Epithelial Wet Prep HPF POC     Yeast Wet Prep HPF POC neg    Bacteria Wet Prep HPF POC     RBC Wet Prep HPF POC     WBC Wet Prep HPF POC     KOH Prep POC Negative Negative     Assessment/Plan: Vaginal burning - Sx right after sex only, with urination. Neg exam/wet prep. Most likely due to friction. Try lubricant. F/u prn.  - Plan: POCT Wet Prep with KOH    Return if symptoms worsen or fail to improve.  Neeti Knudtson B. Meryl Ponder, PA-C 04/03/2019 10:00 AM

## 2019-04-03 NOTE — Telephone Encounter (Signed)
Patient scheduled for nexplanon replacement with ABC 7/1

## 2019-04-11 NOTE — Telephone Encounter (Signed)
Noted. Will order to arrive by apt date/time. 

## 2019-04-25 ENCOUNTER — Telehealth: Payer: Self-pay | Admitting: Emergency Medicine

## 2019-04-25 ENCOUNTER — Other Ambulatory Visit: Payer: Self-pay

## 2019-04-25 ENCOUNTER — Ambulatory Visit
Admission: EM | Admit: 2019-04-25 | Discharge: 2019-04-25 | Disposition: A | Discharge status: Home / Self Care | Payer: Managed Care, Other (non HMO) | Attending: Urgent Care | Admitting: Urgent Care

## 2019-04-25 ENCOUNTER — Encounter: Payer: Self-pay | Admitting: Emergency Medicine

## 2019-04-25 DIAGNOSIS — Z202 Contact with and (suspected) exposure to infections with a predominantly sexual mode of transmission: Secondary | ICD-10-CM | POA: Diagnosis not present

## 2019-04-25 DIAGNOSIS — R300 Dysuria: Secondary | ICD-10-CM

## 2019-04-25 LAB — WET PREP, GENITAL
Clue Cells Wet Prep HPF POC: NONE SEEN
Sperm: NONE SEEN
Trich, Wet Prep: NONE SEEN
Yeast Wet Prep HPF POC: NONE SEEN

## 2019-04-25 LAB — URINALYSIS, COMPLETE (UACMP) WITH MICROSCOPIC
Bilirubin Urine: NEGATIVE
Glucose, UA: NEGATIVE mg/dL
Hgb urine dipstick: NEGATIVE
Ketones, ur: NEGATIVE mg/dL
Nitrite: NEGATIVE
Protein, ur: NEGATIVE mg/dL
RBC / HPF: NONE SEEN RBC/hpf (ref 0–5)
Specific Gravity, Urine: 1.015 (ref 1.005–1.030)
pH: 6 (ref 5.0–8.0)

## 2019-04-25 LAB — CHLAMYDIA/NGC RT PCR (ARMC ONLY)
Chlamydia Tr: NOT DETECTED
N gonorrhoeae: NOT DETECTED

## 2019-04-25 LAB — PREGNANCY, URINE: Preg Test, Ur: NEGATIVE

## 2019-04-25 MED ORDER — PHENAZOPYRIDINE HCL 200 MG PO TABS: 200.0000 mg | ORAL_TABLET | Freq: Three times a day (TID) | ORAL | 0 refills | Status: DC

## 2019-04-25 NOTE — Telephone Encounter (Signed)
Contacted patient and notified her of negative urine GC. Patient verbalized understanding.

## 2019-04-25 NOTE — ED Triage Notes (Signed)
Patient c/o low back pain and lower abdominal pain that started Monday. Patient c/o dysuria and vaginal discharge. Patient is concerned for STDs. Patient is currently on Macrobid for a possible UTI.

## 2019-04-25 NOTE — Discharge Instructions (Addendum)
It was very nice meeting you today in clinic. Thank you for entrusting me with your care.   As discussed, your urine did not appear to be infected. Your wet prep was NEGATIVE for vaginosis. Gonorrhea and chlamydia testing pending. We will contact you if the results are positive later today.   Make arrangements to follow up with your regular doctor in 1 week for re-evaluation if still having symptoms. If your symptoms/condition worsens, please seek follow up care either here or in the ER. Please remember, our [facility] providers are "right here with you" when you need [place].   Again, it was my pleasure to take care of you today. Thank you for choosing our clinic. I hope that you start to feel better quickly.   Quentin Mulling, MSN, APRN, FNP-C, CEN Advanced Practice Provider [facility]

## 2019-04-25 NOTE — ED Provider Notes (Signed)
[address] [place], [place] [address] [phone]   Name: Kathy Romero DOB: 1996-06-30 MRN: [id number] CSN: [id number] PCP: [facility]  Arrival date and time:  04/25/19 1497  Chief Complaint:  Dysuria and Exposure to STD  NOTE: Prior to seeing the patient today, I have reviewed the triage nursing documentation and vital signs. Clinical staff has updated patient's PMH/PSHx, current medication list, and drug allergies/intolerances to ensure comprehensive history available to assist in medical decision making.   History:   HPI: Kathy Romero is a 23 y.o. female who presents today with complaints urinary symptoms that began with acute onset on 04/22/2019.  Patient complains of pain in her lower back, suprapubic pain, and dysuria.  She notes associated urinary frequency and urgency, with no appreciated any gross hematuria. Patient denies the fact that her urine has been malodorous. Patient has not experienced nausea, vomiting, fever, and chills. She contacted a telehealth service ([facility]) on 04/24/2019 and was subsequently started on a course of nitrofurantoin.  Patient states, "I have taken one dose of the antibiotics and my abdominal pain has gone away".  Patient endorses that she engages in unprotected sexual activity. She notes that sexual activity is in the context of a committed monogamous relationship with a single female partner. She denies any vaginal pain or bleeding. She advises that she has noticed some "light tan" discharge. LMP is unknown as patient has Implanon implant. She took at home urine HCG test that resulted as negative.   Past Medical History:  Diagnosis Date  . ASCUS with positive high risk HPV cervical 2018  . Hemorrhoids 2019  . Vitamin D deficiency     Past Surgical History:  Procedure Laterality Date  . NO PAST SURGERIES      Family History  Problem Relation Age of Onset  . Hypertension Mother   . Diabetes Mother    . Hypertension Father   . Diabetes Father   . Colon cancer Other     Social History   Socioeconomic History  . Marital status: Single    Spouse name: Not on file  . Number of children: Not on file  . Years of education: Not on file  . Highest education level: Not on file  Occupational History  . Not on file  Social Needs  . Financial resource strain: Not on file  . Food insecurity:    Worry: Not on file    Inability: Not on file  . Transportation needs:    Medical: Not on file    Non-medical: Not on file  Tobacco Use  . Smoking status: Current Every Day Smoker    Packs/day: 0.25    Years: 3.00    Pack years: 0.75    Types: Cigarettes  . Smokeless tobacco: Never Used  Substance and Sexual Activity  . Alcohol use: Yes    Frequency: Never    Comment: rarely  . Drug use: No  . Sexual activity: Yes    Birth control/protection: Implant  Lifestyle  . Physical activity:    Days per week: Not on file    Minutes per session: Not on file  . Stress: Not on file  Relationships  . Social connections:    Talks on phone: Not on file    Gets together: Not on file    Attends religious service: Not on file    Active member of club or organization: Not on file    Attends meetings of clubs or organizations:  Not on file    Relationship status: Not on file  . Intimate partner violence:    Fear of current or ex partner: Not on file    Emotionally abused: Not on file    Physically abused: Not on file    Forced sexual activity: Not on file  Other Topics Concern  . Not on file  Social History Narrative  . Not on file    Patient Active Problem List   Diagnosis Date Noted  . ASCUS with positive high risk HPV cervical 01/08/2019  . Breakthrough bleeding on Nexplanon 10/11/2018    Home Medications:    Current Meds  Medication Sig  . clonazePAM (KLONOPIN) 1 MG tablet 1/2 to 1 po q 12 hours prn  . etonogestrel (IMPLANON) 68 MG IMPL implant Inject into the skin.  . fluticasone  (FLONASE) 50 MCG/ACT nasal spray Place 2 sprays into both nostrils daily.  . meloxicam (MOBIC) 15 MG tablet Take 1 tablet (15 mg total) by mouth daily.  . nitrofurantoin, macrocrystal-monohydrate, (MACROBID) 100 MG capsule Take 100 mg by mouth 2 (two) times daily.  Marland Kitchen VITAMIN D, CHOLECALCIFEROL, PO Take by mouth.    Allergies:   Sulfamethoxazole-trimethoprim  Review of Systems (ROS): Review of Systems  Constitutional: Negative for chills and fever.  Respiratory: Negative for cough and shortness of breath.   Cardiovascular: Negative for chest pain and palpitations.  Genitourinary: Positive for dyspareunia, dysuria, frequency, urgency and vaginal discharge. Negative for flank pain, hematuria, pelvic pain, vaginal bleeding and vaginal pain.  Musculoskeletal: Positive for back pain.  Neurological: Negative for dizziness and headaches.  Psychiatric/Behavioral: Negative.      Physical Exam:  Triage Vital Signs ED Triage Vitals  Enc Vitals Group     BP 04/25/19 0819 126/70     Pulse Rate 04/25/19 0819 93     Resp 04/25/19 0819 18     Temp 04/25/19 0819 98.7 F (37.1 C)     Temp Source 04/25/19 0819 Oral     SpO2 04/25/19 0819 100 %     Weight 04/25/19 0816 193 lb (87.5 kg)     Height 04/25/19 0816  (1.575 m)     Head Circumference --      Peak Flow --      Pain Score 04/25/19 0816 0     Pain Loc --      Pain Edu? --      Excl. in GC? --     Physical Exam  Constitutional: She is oriented to person, place, and time and well-developed, well-nourished, and in no distress.  HENT:  Head: Normocephalic and atraumatic.  Mouth/Throat: Mucous membranes are normal.  Neck: Neck supple.  Cardiovascular: Normal rate, regular rhythm, normal heart sounds and intact distal pulses. Exam reveals no gallop and no friction rub.  No murmur heard. Pulmonary/Chest: Effort normal and breath sounds normal. No respiratory distress. She has no wheezes. She has no rales.  Abdominal: Soft. Normal  appearance and bowel sounds are normal. She exhibits no distension. There is no abdominal tenderness. There is no CVA tenderness.  Neurological: She is alert and oriented to person, place, and time.  Skin: Skin is warm and dry. No rash noted. No erythema.  Psychiatric: Mood, affect and judgment normal.  Nursing note and vitals reviewed.    Urgent Care Treatments / Results:   LABS: PLEASE NOTE: all labs that were ordered this encounter are listed, however only abnormal results are displayed. Labs Reviewed  WET PREP, GENITAL -  Abnormal; Notable for the following components:      Result Value   WBC, Wet Prep HPF POC FEW (*)    All other components within normal limits  URINALYSIS, COMPLETE (UACMP) WITH MICROSCOPIC - Abnormal; Notable for the following components:   APPearance HAZY (*)    Leukocytes,Ua TRACE (*)    Bacteria, UA FEW (*)    All other components within normal limits  CHLAMYDIA/NGC RT PCR ([facility] ONLY)  URINE CULTURE  PREGNANCY, URINE    EKG: -None  RADIOLOGY: No results found.  PRODEDURES: Procedures  MEDICATIONS RECEIVED THIS VISIT: Medications - No data to display  PERTINENT CLINICAL COURSE NOTES/UPDATES: No data to display   Initial Impression / Assessment and Plan / Urgent Care Course:    Wilburt Finlayaliyah S Manzano is a 23 y.o. female who [facility] with complaints of Dysuria and Exposure to STD  Pertinent labs & imaging results that were available during my care of the patient were personally reviewed by me and considered in my medical decision making (see lab/imaging section of note for values and interpretations).  Exam reassuring.  She complains of back pain and lower abdominal pain in the setting of urinary symptoms and possible STD exposure.  No fevers.  Patient currently on a course of Macrobid as prescribed by telehealth provider.  UA reviewed as negative for infection; reflex culture sent for patient request as she notes  that her cultures "have come back positive in the past" when the UA was unrevealing.  Wet prep negative for clue cells; no bacterial vaginosis.  GC urine negative.  Patient advised to discontinue course of nitrofurantoin with the rationale being that urine shows no signs of infection.  She was made aware that if culture comes back positive, she will be contacted and advised to restart.  Patient requesting Pyridium prescription for urinary pain, which I feel is reasonable.  Prescription sent per patient request.  Patient encouraged to increase fluid intake to flush urinary tract.  Encouraged to avoid caffeine to prevent painful bladder spasms.  Discussed safe sexual practices to prevent STI exposure.  Written information also provided.  Discussed follow up with primary care physician in 1 week for re-evaluation. I have reviewed the follow up and strict return precautions for any new or worsening symptoms. Patient is aware of symptoms that would be deemed urgent/emergent, and would thus require further evaluation either here or in the emergency department. At the time of discharge, she verbalized understanding and consent with the discharge plan as it was reviewed with her. All questions were fielded by provider and/or clinic staff prior to patient discharge.    Final Clinical Impressions(s) / Urgent Care Diagnoses:   Final diagnoses:  Dysuria  Exposure to STD    New Prescriptions:   Meds ordered this encounter  Medications  . phenazopyridine (PYRIDIUM) 200 MG tablet    Sig: Take 1 tablet (200 mg total) by mouth 3 (three) times daily.    Dispense:  6 tablet    Refill:  0    Controlled Substance Prescriptions:  [place] Controlled Substance Registry consulted? Not Applicable  NOTE: This note was prepared using Dragon dictation software along with smaller phrase technology. Despite my best ability to proofread, there is the potential that transcriptional errors may still occur from this process, and  are completely unintentional.     Verlee MonteGray, Idali Lafever E, NP 04/25/19 1758

## 2019-04-26 LAB — URINE CULTURE: Culture: <10000 — AB

## 2019-05-28 NOTE — Progress Notes (Signed)
   Chief Complaint  Patient presents with  . Contraception    Nexplanon removal/reinsertion     History of Present Illness:  Kathy Romero is a 23 y.o. that had her 2nd nexplanon placed approximately 3 years  ago. Since that time, she has done very well. She would like a replacement. Annual done 2/20.   BP 110/80   Ht 5\' 2"  (1.575 m)   Wt 189 lb (85.7 kg)   BMI 34.57 kg/m     Nexplanon removal Procedure note - The Nexplanon was noted in the patient's arm and the end was identified. The skin was cleansed with a Betadine solution. A small injection of subcutaneous lidocaine with epinephrine was given over the end of the implant. An incision was made at the end of the implant. The rod was noted in the incision and grasped with a hemostat. It was noted to be intact.  Steri-Strip was placed approximating the incision. Hemostasis was noted.   Nexplanon Insertion  Patient given informed consent, signed copy in the chart, time out was performed.  Appropriate time out taken.  Patient's LEFTarm was prepped and draped in the usual sterile fashion. The ruler used to measure and mark insertion area.  Pt was prepped with betadine swab and then injected with 1.0 cc of 2% lidocaine with epinephrine. Nexplanon removed form packaging,  Device confirmed in needle, then inserted full length of needle and withdrawn per handbook instructions.  Pt insertion site covered with steri-strip and a bandage.   Minimal blood loss.  Pt tolerated the procedure welL.  Assessment: Nexplanon removal - Plan: Pt did well.  Nexplanon insertion - Plan: etonogestrel (IMPLANON) 35 MG IMPL implant,    Meds ordered this encounter  Medications  . etonogestrel (IMPLANON) 68 MG IMPL implant    Sig: 1 each (68 mg total) by Subdermal route once for 1 dose.    Dispense:  1 each    Refill:  0    Order Specific Question:   Supervising Provider    Answer:   Gae Dry [[id number]]     Plan:   She was told to remove  the dressing in 12-24 hours, to keep the incision area dry for 24 hours and to remove the Steristrip in 2-3  days.  Notify us if any signs of tenderness, redness, pain, or fevers develop.   Novia Lansberry B. Amoreena Neubert, PA-C 05/29/2019 9:10 AM

## 2019-05-29 ENCOUNTER — Other Ambulatory Visit: Payer: Self-pay

## 2019-05-29 ENCOUNTER — Ambulatory Visit (INDEPENDENT_AMBULATORY_CARE_PROVIDER_SITE_OTHER): Payer: Managed Care, Other (non HMO) | Admitting: Obstetrics and Gynecology

## 2019-05-29 ENCOUNTER — Encounter: Payer: Self-pay | Admitting: Obstetrics and Gynecology

## 2019-05-29 VITALS — BP 110/80 | Ht 62.0 in | Wt 189.0 lb

## 2019-05-29 DIAGNOSIS — Z3049 Encounter for surveillance of other contraceptives: Secondary | ICD-10-CM | POA: Diagnosis not present

## 2019-05-29 DIAGNOSIS — Z3046 Encounter for surveillance of implantable subdermal contraceptive: Secondary | ICD-10-CM

## 2019-05-29 DIAGNOSIS — Z30017 Encounter for initial prescription of implantable subdermal contraceptive: Secondary | ICD-10-CM

## 2019-05-29 MED ORDER — ETONOGESTREL 68 MG ~~LOC~~ IMPL: 1.0000 | DRUG_IMPLANT | Freq: Once | SUBCUTANEOUS | 0 refills | Status: AC

## 2019-05-29 NOTE — Patient Instructions (Signed)
I value your feedback and entrusting us with your care. If you get a [facility] patient survey, I would appreciate you taking the time to let us know about your experience today. Thank you!  Remove the dressing in 24 hours,  keep the incision area dry for 24 hours and remove the Steristrip in 2-3  days.  Notify us if any signs of tenderness, redness, pain, or fevers develop.  

## 2019-10-11 ENCOUNTER — Other Ambulatory Visit: Payer: Self-pay

## 2019-10-11 ENCOUNTER — Ambulatory Visit
Admission: EM | Admit: 2019-10-11 | Discharge: 2019-10-11 | Disposition: A | Discharge status: Home / Self Care | Payer: Managed Care, Other (non HMO)

## 2019-10-11 ENCOUNTER — Encounter: Payer: Self-pay | Admitting: Emergency Medicine

## 2019-10-11 DIAGNOSIS — M545 Low back pain: Secondary | ICD-10-CM | POA: Diagnosis not present

## 2019-10-11 DIAGNOSIS — S29012A Strain of muscle and tendon of back wall of thorax, initial encounter: Secondary | ICD-10-CM

## 2019-10-11 MED ORDER — CYCLOBENZAPRINE HCL 10 MG PO TABS: ORAL_TABLET | ORAL | 0 refills | Status: DC

## 2019-10-11 NOTE — Discharge Instructions (Signed)
Recommend continue Meloxicam 15mg  daily as directed. May start Flexeril 10mg  tablets- take 1/2 to 1 whole tablet every 8 hours as needed for muscle pain/spasms- will cause drowsiness. May apply warm compresses to area for comfort. Follow-up in 3 to 4 days if not improving.

## 2019-10-11 NOTE — ED Provider Notes (Signed)
[facility]    CSN: [id number] Arrival date & time: 10/11/19  0815      History   Chief Complaint Chief Complaint  Patient presents with  . Back Pain    left     HPI Kathy Romero is a 23 y.o. female.   23 year old female presents with left mid-back pain and tenderness that started yesterday afternoon. She was styling her hair and reaching up when she experienced a sharp pain in the left mid-section of her back. It continued to be painful most of the evening and woke her up many times last night. Pain increases with certain movements but does not radiate. She denies any fever, shortness of breath, difficulty breathing or anterior chest pain. She has used muscle rub topical medication with minimal relief. She did take a Mobic and used a heating pad with some relief. She has a history of occasional muscle spasms on her right mid-back but not usually on her left. Other chronic health issues include mild anxiety and is on Prozac daily. Other medications include Implanon and Vit D.   The history is provided by the patient.    Past Medical History:  Diagnosis Date  . ASCUS with positive high risk HPV cervical 2018  . Hemorrhoids 2019  . Vitamin D deficiency     Patient Active Problem List   Diagnosis Date Noted  . ASCUS with positive high risk HPV cervical 01/08/2019  . Breakthrough bleeding on Nexplanon 10/11/2018    Past Surgical History:  Procedure Laterality Date  . NO PAST SURGERIES      OB History    Gravida  0   Para  0   Term  0   Preterm  0   AB  0   Living  0     SAB  0   TAB  0   Ectopic  0   Multiple  0   Live Births  0            Home Medications    Prior to Admission medications   Medication Sig Start Date End Date Taking? Authorizing Provider  etonogestrel (IMPLANON) 68 MG IMPL implant 1 each (68 mg total) by Subdermal route once for 1 dose. 05/29/19 21/30/86 Yes Copland, Deirdre Evener, PA-C  FLUoxetine (PROZAC) 20 MG  tablet Take by mouth. 08/19/19 11/17/19 Yes [provider]  meloxicam (MOBIC) 15 MG tablet Take 1 tablet (15 mg total) by mouth daily. 01/01/19  Yes Lorin Picket, PA-C  cyclobenzaprine (FLEXERIL) 10 MG tablet Take 1/2 to 1 whole tablet by mouth every 8 hours as needed for muscle pain/spasms. 10/11/19   Katy Apo, NP  fluticasone (FLONASE) 50 MCG/ACT nasal spray Place 2 sprays into both nostrils daily. 09/04/18   Lorin Picket, PA-C  VITAMIN D, CHOLECALCIFEROL, PO Take by mouth.    [provider]  clonazePAM Bobbye [place]) 1 MG tablet 1/2 to 1 po q 12 hours prn 03/01/19 10/11/19  [provider]    Family History Family History  Problem Relation Age of Onset  . Hypertension Mother   . Diabetes Mother   . Hypertension Father   . Diabetes Father   . Colon cancer Other     Social History Social History   Tobacco Use  . Smoking status: Current Every Day Smoker    Packs/day: 0.25    Years: 3.00    Pack years: 0.75    Types: Cigarettes  . Smokeless tobacco: Never  Used  Substance Use Topics  . Alcohol use: Yes    Frequency: Never    Comment: rarely  . Drug use: No     Allergies   Sulfamethoxazole-trimethoprim   Review of Systems Review of Systems  Constitutional: Negative for activity change, appetite change, chills, diaphoresis, fatigue and fever.  HENT: Negative for congestion and postnasal drip.   Respiratory: Negative for cough, chest tightness, shortness of breath and wheezing.   Cardiovascular: Negative for chest pain and palpitations.  Gastrointestinal: Negative for constipation, diarrhea, nausea and vomiting.  Genitourinary: Negative for decreased urine volume, difficulty urinating and hematuria.  Musculoskeletal: Positive for back pain and myalgias. Negative for arthralgias, gait problem, neck pain and neck stiffness.  Skin: Negative for color change, rash and wound.  Allergic/Immunologic: Positive for environmental allergies  (occasional seasonal allergies). Negative for food allergies and immunocompromised state.  Neurological: Negative for dizziness, tremors, seizures, syncope, weakness, light-headedness, numbness and headaches.  Hematological: Negative for adenopathy. Does not bruise/bleed easily.  Psychiatric/Behavioral: Positive for sleep disturbance.     Physical Exam Triage Vital Signs ED Triage Vitals [10/11/19 0823]  Enc Vitals Group     BP 138/81     Pulse Rate 87     Resp 14     Temp 98.6 F (37 C)     Temp Source Oral     SpO2 100 %     Weight 182 lb (82.6 kg)     Height 5\' 2"  (1.575 m)     Head Circumference      Peak Flow      Pain Score 6     Pain Loc      Pain Edu?      Excl. in GC?    No data found.  Updated Vital Signs BP 138/81 (BP Location: Left Arm)   Pulse 87   Temp 98.6 F (37 C) (Oral)   Resp 14   Ht 5\' 2"  (1.575 m)   Wt 182 lb (82.6 kg)   SpO2 100%   BMI 33.29 kg/m   Visual Acuity Right Eye Distance:   Left Eye Distance:   Bilateral Distance:    Right Eye Near:   Left Eye Near:    Bilateral Near:     Physical Exam Vitals signs and nursing note reviewed.  Constitutional:      General: She is awake. She is not in acute distress.    Appearance: Normal appearance. She is well-developed and well-groomed. She is not ill-appearing.     Comments: Patient sitting comfortably on exam table in no acute distress.   HENT:     Head: Normocephalic and atraumatic.     Right Ear: External ear normal.     Left Ear: External ear normal.  Eyes:     Extraocular Movements: Extraocular movements intact.     Conjunctiva/sclera: Conjunctivae normal.  Neck:     Musculoskeletal: Normal range of motion and neck supple. No neck rigidity or muscular tenderness.  Cardiovascular:     Rate and Rhythm: Normal rate and regular rhythm.     Heart sounds: Normal heart sounds. No murmur.  Pulmonary:     Effort: Pulmonary effort is normal. No respiratory distress.     Breath sounds:  Normal breath sounds and air entry. No decreased air movement. No decreased breath sounds, wheezing, rhonchi or rales.    Musculoskeletal:        General: Tenderness present. No swelling.     Thoracic back: She exhibits tenderness and pain.  She exhibits normal range of motion, no swelling, no edema and no spasm.     Comments: Has full range of motion of back but pain with rotation and full abduction of left arm/shoulder. No distinct swelling. No redness or rash. Tender along mid-scapular area on left side of posterior thoracic area. Muscle tightness present. No neuro deficits noted.   Skin:    General: Skin is warm and dry.     Capillary Refill: Capillary refill takes less than 2 seconds.     Findings: No rash.  Neurological:     General: No focal deficit present.     Mental Status: She is alert and oriented to person, place, and time.     Sensory: Sensation is intact. No sensory deficit.     Motor: Motor function is intact. No weakness.     Gait: Gait is intact.     Deep Tendon Reflexes: Reflexes normal.  Psychiatric:        Mood and Affect: Mood normal.        Behavior: Behavior normal. Behavior is cooperative.        Thought Content: Thought content normal.        Judgment: Judgment normal.      UC Treatments / Results  Labs (all labs ordered are listed, but only abnormal results are displayed) Labs Reviewed - No data to display  EKG   Radiology No results found.  Procedures Procedures (including critical care time)  Medications Ordered in UC Medications - No data to display  Initial Impression / Assessment and Plan / UC Course  I have reviewed the triage vital signs and the nursing notes.  Pertinent labs & imaging results that were available during my care of the patient were reviewed by me and considered in my medical decision making (see chart for details).    Reviewed with patient that she probably has a mild muscle strain in the mid-section of her back.  Recommend continue Mobic 15mg  daily. May use Flexeril 10mg  at night and may take 1/2 tablet every 8 hours during the day if needed. Recommend apply warm compresses to area for comfort. Note written for work today. Recommend follow-up in 3 to 4 days if not improving.  Final Clinical Impressions(s) / UC Diagnoses   Final diagnoses:  Strain of mid-back, initial encounter     Discharge Instructions     Recommend continue Meloxicam 15mg  daily as directed. May start Flexeril 10mg  tablets- take 1/2 to 1 whole tablet every 8 hours as needed for muscle pain/spasms- will cause drowsiness. May apply warm compresses to area for comfort. Follow-up in 3 to 4 days if not improving.     ED Prescriptions    Medication Sig Dispense Auth. Provider   cyclobenzaprine (FLEXERIL) 10 MG tablet Take 1/2 to 1 whole tablet by mouth every 8 hours as needed for muscle pain/spasms. 15 tablet Syona Wroblewski, Ali LoweAnn Berry, NP     PDMP not reviewed this encounter.   Sudie GrumblingAmyot, Braheem Tomasik Berry, NP 10/11/19 1737

## 2019-10-11 NOTE — ED Triage Notes (Signed)
Patient states that she was styling her hair yesterday and felt a sharp pain on the left side of her mid back.  Patient states that when she turns or bends it makes her pain worse.  Patient also reports muscle tightness in her back.

## 2019-10-12 IMAGING — CR DG FOOT COMPLETE 3+V*R*
3 series · 3 of 3 positions shown · non-contrast
Comparison: None.

CLINICAL DATA: Right heel pain for the past 4 days.

EXAM:
RIGHT FOOT COMPLETE - 3+ VIEW

[foot ap]
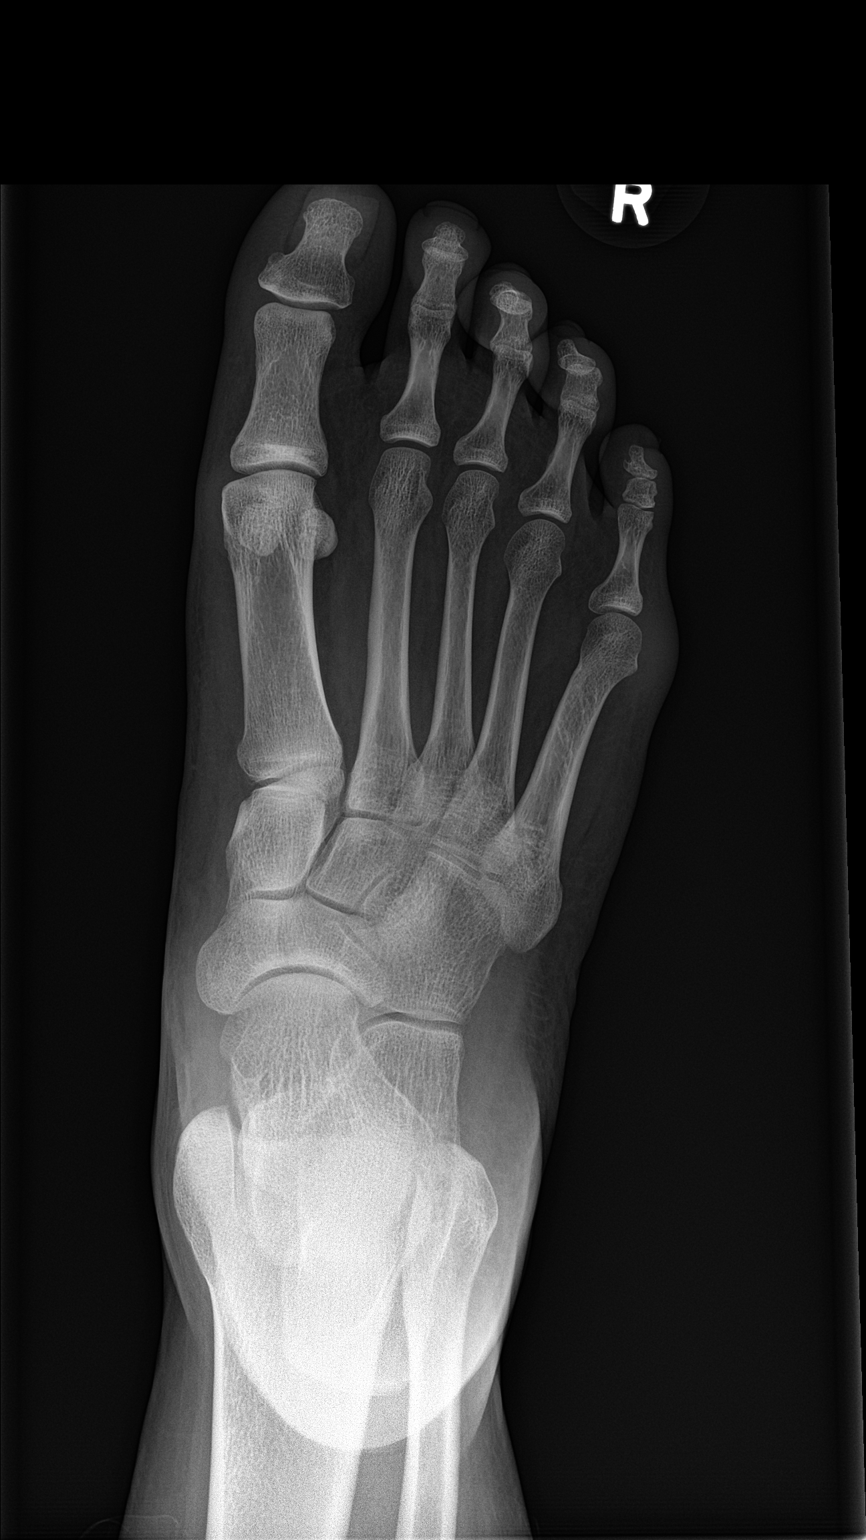

[foot obl]
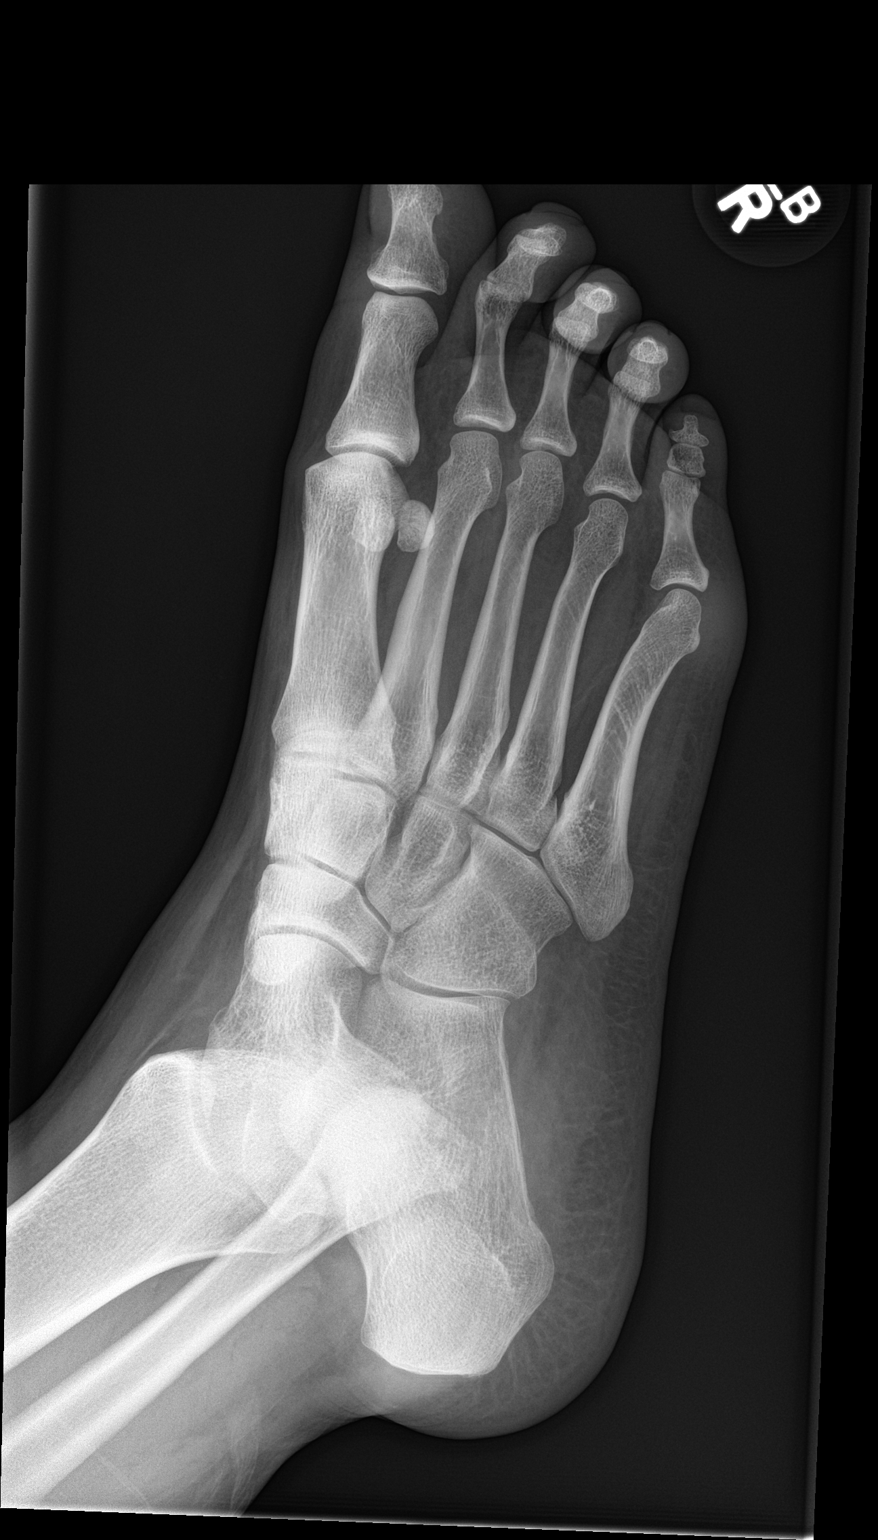

[foot lat]
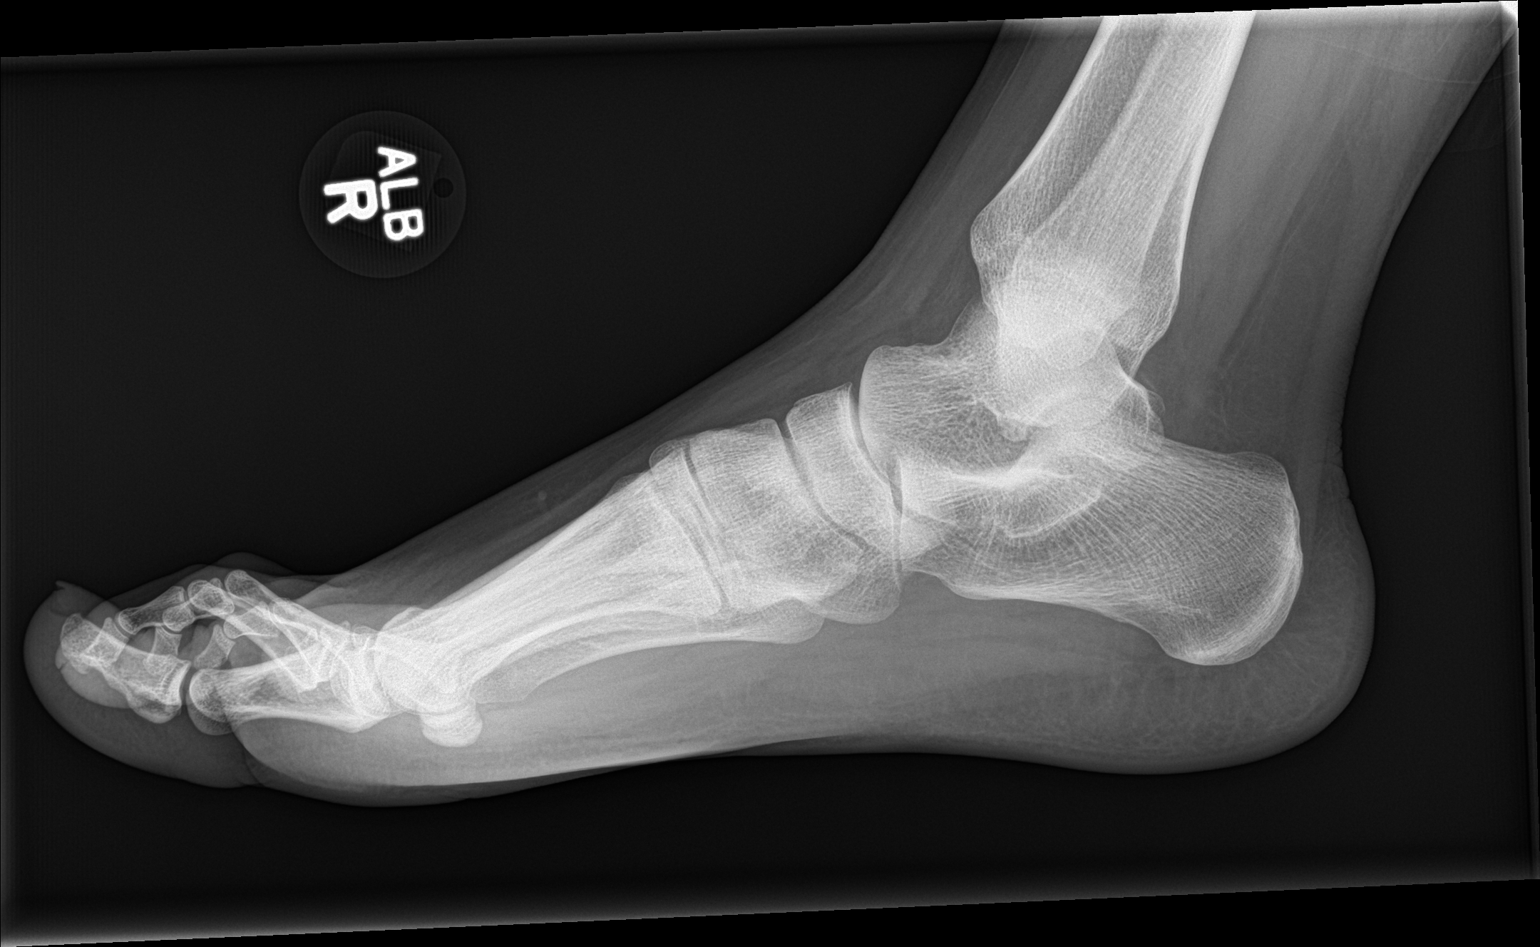

[3 of 3 positions shown; findings below may reference images not displayed]

FINDINGS: There is no evidence of fracture or dislocation. There is no
evidence of arthropathy or other focal bone abnormality. Soft
tissues are unremarkable.
IMPRESSION: Normal examination.

## 2020-02-10 ENCOUNTER — Other Ambulatory Visit: Payer: Self-pay

## 2020-02-10 ENCOUNTER — Ambulatory Visit (LOCAL_COMMUNITY_HEALTH_CENTER): Payer: Self-pay

## 2020-02-10 DIAGNOSIS — Z111 Encounter for screening for respiratory tuberculosis: Secondary | ICD-10-CM

## 2020-02-13 ENCOUNTER — Other Ambulatory Visit: Payer: Self-pay

## 2020-02-13 ENCOUNTER — Ambulatory Visit (LOCAL_COMMUNITY_HEALTH_CENTER): Payer: Self-pay

## 2020-02-13 DIAGNOSIS — Z111 Encounter for screening for respiratory tuberculosis: Secondary | ICD-10-CM

## 2020-02-13 LAB — TB SKIN TEST
Induration: 0 mm
TB Skin Test: NEGATIVE

## 2020-02-17 NOTE — Telephone Encounter (Signed)
Nexplanon rcvd/charged 05/29/2019

## 2020-03-05 ENCOUNTER — Ambulatory Visit
Admission: EM | Admit: 2020-03-05 | Discharge: 2020-03-05 | Disposition: A | Discharge status: Home / Self Care | Payer: Self-pay | Attending: Family Medicine | Admitting: Family Medicine

## 2020-03-05 ENCOUNTER — Other Ambulatory Visit: Payer: Self-pay

## 2020-03-05 ENCOUNTER — Encounter: Payer: Self-pay | Admitting: Emergency Medicine

## 2020-03-05 DIAGNOSIS — A5901 Trichomonal vulvovaginitis: Secondary | ICD-10-CM | POA: Insufficient documentation

## 2020-03-05 LAB — WET PREP, GENITAL
Clue Cells Wet Prep HPF POC: NONE SEEN
Sperm: NONE SEEN
Yeast Wet Prep HPF POC: NONE SEEN

## 2020-03-05 MED ORDER — METRONIDAZOLE 500 MG PO TABS: 500.0000 mg | ORAL_TABLET | Freq: Two times a day (BID) | ORAL | 0 refills | Status: DC

## 2020-03-05 NOTE — ED Provider Notes (Signed)
[facility]    CSN: [id number] Arrival date & time: 03/05/20  1456  History   Chief Complaint Chief Complaint  Patient presents with  . Vaginal Discharge  . Vaginal Itching   HPI  24 year old female presents with the above complaints.  Patient reports a 2-week history of vaginal itching and vaginal discharge.  She states that she has not been sexually active since January.  She states that she had a telemedicine visit on 02/28/20 via [facility].  Note was reviewed.  In summary, patient reported complaints of vaginal itching and burning and white discharge.  She was given a single dose of Diflucan.  Patient states that the Diflucan has not helped.  In fact, she feels like her symptoms are worsening.  No relieving factors.  No known inciting factor.  No other reported symptoms.  No other complaints.  Past Medical History:  Diagnosis Date  . ASCUS with positive high risk HPV cervical 2018  . Hemorrhoids 2019  . Vitamin D deficiency    Patient Active Problem List   Diagnosis Date Noted  . ASCUS with positive high risk HPV cervical 01/08/2019  . Breakthrough bleeding on Nexplanon 10/11/2018   Past Surgical History:  Procedure Laterality Date  . NO PAST SURGERIES     OB History    Gravida  0   Para  0   Term  0   Preterm  0   AB  0   Living  0     SAB  0   TAB  0   Ectopic  0   Multiple  0   Live Births  0          Home Medications    Prior to Admission medications   Medication Sig Start Date End Date Taking? Authorizing Provider  etonogestrel (IMPLANON) 68 MG IMPL implant 1 each (68 mg total) by Subdermal route once for 1 dose. 05/29/19 10/11/19  Copland, Ilona Sorrel, PA-C  FLUoxetine (PROZAC) 20 MG tablet Take by mouth. 08/19/19 11/17/19  [provider]  metroNIDAZOLE (FLAGYL) 500 MG tablet Take 1 tablet (500 mg total) by mouth 2 (two) times daily. 03/05/20   Tommie Sams, DO  clonazePAM (KLONOPIN) 1 MG tablet 1/2 to 1 po q 12 hours  prn 03/01/19 10/11/19  [provider]  fluticasone (FLONASE) 50 MCG/ACT nasal spray Place 2 sprays into both nostrils daily. 09/04/18 03/05/20  Lutricia Feil, PA-C    Family History Family History  Problem Relation Age of Onset  . Hypertension Mother   . Diabetes Mother   . Hypertension Father   . Diabetes Father   . Colon cancer Other   . Diabetes Maternal Grandmother   . Hypertension Maternal Grandmother   . Stroke Maternal Grandmother   . Diabetes Maternal Grandfather     Social History Social History   Tobacco Use  . Smoking status: Current Every Day Smoker    Packs/day: 0.25    Years: 3.00    Pack years: 0.75    Types: Cigarettes  . Smokeless tobacco: Never Used  Substance Use Topics  . Alcohol use: Yes    Comment: rarely  . Drug use: No     Allergies   Sulfamethoxazole-trimethoprim   Review of Systems Review of Systems  Constitutional: Negative.   Genitourinary: Positive for vaginal discharge.       Vaginal itching.   Physical Exam Triage Vital Signs ED Triage Vitals  Enc Vitals Group  BP 03/05/20 1517 132/82     Pulse Rate 03/05/20 1517 72     Resp 03/05/20 1517 18     Temp 03/05/20 1517 98.6 F (37 C)     Temp Source 03/05/20 1517 Oral     SpO2 03/05/20 1517 100 %     Weight 03/05/20 1515 188 lb (85.3 kg)     Height 03/05/20 1515 5\' 2"  (1.575 m)     Head Circumference --      Peak Flow --      Pain Score 03/05/20 1515 0     Pain Loc --      Pain Edu? --      Excl. in GC? --    Updated Vital Signs BP 132/82 (BP Location: Left Arm)   Pulse 72   Temp 98.6 F (37 C) (Oral)   Resp 18   Ht 5\' 2"  (1.575 m)   Wt 85.3 kg   SpO2 100%   BMI 34.39 kg/m   Visual Acuity Right Eye Distance:   Left Eye Distance:   Bilateral Distance:    Right Eye Near:   Left Eye Near:    Bilateral Near:     Physical Exam Vitals and nursing note reviewed.  Constitutional:      General: She is not in acute distress.    Appearance: Normal  appearance. She is not ill-appearing.  HENT:     Head: Normocephalic and atraumatic.  Eyes:     General:        Right eye: No discharge.        Left eye: No discharge.     Conjunctiva/sclera: Conjunctivae normal.  Cardiovascular:     Rate and Rhythm: Normal rate and regular rhythm.  Pulmonary:     Effort: Pulmonary effort is normal.     Breath sounds: Normal breath sounds. No wheezing, rhonchi or rales.  Abdominal:     General: There is no distension.     Palpations: Abdomen is soft.     Tenderness: There is no abdominal tenderness.  Neurological:     Mental Status: She is alert.  Psychiatric:        Mood and Affect: Mood normal.        Behavior: Behavior normal.    UC Treatments / Results  Labs (all labs ordered are listed, but only abnormal results are displayed) Labs Reviewed  WET PREP, GENITAL - Abnormal; Notable for the following components:      Result Value   Trich, Wet Prep PRESENT (*)    WBC, Wet Prep HPF POC MANY (*)    All other components within normal limits    EKG   Radiology No results found.  Procedures Procedures (including critical care time)  Medications Ordered in UC Medications - No data to display  Initial Impression / Assessment and Plan / UC Course  I have reviewed the triage vital signs and the nursing notes.  Pertinent labs & imaging results that were available during my care of the patient were reviewed by me and considered in my medical decision making (see chart for details).    24 year old female presents with vaginal discharge and vaginal itching.  Wet prep revealed trichomonas.  Patient informed of the results and the fact that this is sexually transmitted.  Patient deferred additional STD testing as she has an upcoming GYN appointment.  Flagyl as directed.  Advised to abstain from sex while on the medication.  Advised no alcohol.  Final Clinical Impressions(s) /  UC Diagnoses   Final diagnoses:  Trichomonas vaginitis    Discharge Instructions   None    ED Prescriptions    Medication Sig Dispense Auth. Provider   metroNIDAZOLE (FLAGYL) 500 MG tablet Take 1 tablet (500 mg total) by mouth 2 (two) times daily. 14 tablet Coral Spikes, DO     PDMP not reviewed this encounter.   Coral Spikes, [place] 03/05/20 1721

## 2020-03-05 NOTE — ED Triage Notes (Signed)
Patient c/o vaginal itching and discharge x 2 weeks. She states she did a virtual visit on Friday and was given 1 dose of Fluconazole. She states this did not help.

## 2020-03-06 ENCOUNTER — Ambulatory Visit: Payer: Self-pay

## 2020-04-01 ENCOUNTER — Other Ambulatory Visit (HOSPITAL_COMMUNITY)
Admission: RE | Admit: 2020-04-01 | Discharge: 2020-04-01 | Disposition: A | Discharge status: Home / Self Care | Payer: Medicaid Other | Source: Ambulatory Visit | Attending: Obstetrics and Gynecology | Admitting: Obstetrics and Gynecology

## 2020-04-01 ENCOUNTER — Ambulatory Visit (INDEPENDENT_AMBULATORY_CARE_PROVIDER_SITE_OTHER): Payer: Medicaid Other | Admitting: Obstetrics and Gynecology

## 2020-04-01 ENCOUNTER — Other Ambulatory Visit: Payer: Self-pay

## 2020-04-01 ENCOUNTER — Encounter: Payer: Self-pay | Admitting: Obstetrics and Gynecology

## 2020-04-01 VITALS — BP 110/64 | Ht 62.0 in | Wt 183.0 lb

## 2020-04-01 DIAGNOSIS — R87610 Atypical squamous cells of undetermined significance on cytologic smear of cervix (ASC-US): Secondary | ICD-10-CM | POA: Insufficient documentation

## 2020-04-01 DIAGNOSIS — Z124 Encounter for screening for malignant neoplasm of cervix: Secondary | ICD-10-CM | POA: Insufficient documentation

## 2020-04-01 DIAGNOSIS — Z113 Encounter for screening for infections with a predominantly sexual mode of transmission: Secondary | ICD-10-CM | POA: Insufficient documentation

## 2020-04-01 DIAGNOSIS — Z3046 Encounter for surveillance of implantable subdermal contraceptive: Secondary | ICD-10-CM

## 2020-04-01 DIAGNOSIS — A599 Trichomoniasis, unspecified: Secondary | ICD-10-CM | POA: Insufficient documentation

## 2020-04-01 DIAGNOSIS — Z01419 Encounter for gynecological examination (general) (routine) without abnormal findings: Secondary | ICD-10-CM | POA: Diagnosis not present

## 2020-04-01 DIAGNOSIS — R87810 Cervical high risk human papillomavirus (HPV) DNA test positive: Secondary | ICD-10-CM | POA: Diagnosis present

## 2020-04-01 DIAGNOSIS — Z8619 Personal history of other infectious and parasitic diseases: Secondary | ICD-10-CM | POA: Diagnosis not present

## 2020-04-01 NOTE — Patient Instructions (Signed)
I value your feedback and entrusting us with your care. If you get a [facility] patient survey, I would appreciate you taking the time to let us know about your experience today. Thank you!  As of November 07, 2019, your lab results will be released to your MyChart immediately, before I even have a chance to see them. Please give me time to review them and contact you if there are any abnormalities. Thank you for your patience.  

## 2020-04-01 NOTE — Progress Notes (Signed)
PCP:  [facility]   Chief Complaint  Patient presents with  . Gynecologic Exam    would like to be rechecked for trich      HPI:      [name] is a 24 y.o. G0P0000 whose LMP was No LMP recorded. Patient has had an implant., presents today for her annual examination.  Her menses are absent with nexplanon. Dysmenorrhea none.   Sex activity: not currently active - nexplanon Replaced 05/29/19.   Last pap: 01/08/19 Results: normal/neg HPV DNA. Pap 2019 was ASCUS with POS HPV DNA. Repeat pap due today. Hx of STDs: HPV on pap; trich 4/21 at ED treated with flagyl; due for TOC today. Sx resolved, not sex active since tx.  There is no FH of breast cancer. There is no FH of ovarian cancer. The patient  does self-breast exams.  Tobacco use: The patient currently smokes 1/4 packs of cigarettes per day for the past few years. Thinking about quitting.  Alcohol use: social No drug use.  Exercise: moderately active  She does get adequate calcium and Vitamin D in her diet.  Gardasil not completed.    Past Medical History:  Diagnosis Date  . ASCUS with positive high risk HPV cervical 2018  . Hemorrhoids 2019  . Vitamin D deficiency     Past Surgical History:  Procedure Laterality Date  . NO PAST SURGERIES      Family History  Problem Relation Age of Onset  . Hypertension Mother   . Diabetes Mother   . Hypertension Father   . Diabetes Father   . Colon cancer Other   . Diabetes Maternal Grandmother   . Hypertension Maternal Grandmother   . Stroke Maternal Grandmother   . Diabetes Maternal Grandfather     Social History   Socioeconomic History  . Marital status: Single    Spouse name: Not on file  . Number of children: Not on file  . Years of education: Not on file  . Highest education level: Not on file  Occupational History  . Not on file  Tobacco Use  . Smoking status: Current Every Day Smoker    Packs/day: 0.25    Years: 3.00    Pack  years: 0.75    Types: Cigarettes  . Smokeless tobacco: Never Used  Substance and Sexual Activity  . Alcohol use: Yes    Comment: rarely  . Drug use: No  . Sexual activity: Not Currently    Birth control/protection: Implant  Other Topics Concern  . Not on file  Social History Narrative  . Not on file   Social Determinants of Health   Financial Resource Strain:   . Difficulty of Paying Living Expenses:   Food Insecurity:   . Worried About Charity fundraiser in the Last Year:   . Arboriculturist in the Last Year:   Transportation Needs:   . Film/video editor (Medical):   Marland Kitchen Lack of Transportation (Non-Medical):   Physical Activity:   . Days of Exercise per Week:   . Minutes of Exercise per Session:   Stress:   . Feeling of Stress :   Social Connections:   . Frequency of Communication with Friends and Family:   . Frequency of Social Gatherings with Friends and Family:   . Attends Religious Services:   . Active Member of Clubs or Organizations:   . Attends Archivist Meetings:   Marland Kitchen Marital Status:  Intimate Partner Violence:   . Fear of Current or Ex-Partner:   . Emotionally Abused:   Marland Kitchen Physically Abused:   . Sexually Abused:     No outpatient medications have been marked as taking for the 04/01/20 encounter (Office Visit) with Jennilyn Esteve, Ilona Sorrel, PA-C.     ROS:  Review of Systems  Constitutional: Negative for fatigue, fever and unexpected weight change.  Respiratory: Negative for cough, shortness of breath and wheezing.   Cardiovascular: Negative for chest pain, palpitations and leg swelling.  Gastrointestinal: Negative for blood in stool, constipation, diarrhea, nausea and vomiting.  Endocrine: Negative for cold intolerance, heat intolerance and polyuria.  Genitourinary: Negative for dyspareunia, dysuria, flank pain, frequency, genital sores, hematuria, menstrual problem, pelvic pain, urgency, vaginal bleeding, vaginal discharge and vaginal pain.   Musculoskeletal: Negative for arthralgias, back pain, joint swelling and myalgias.  Skin: Negative for rash.  Neurological: Negative for dizziness, syncope, light-headedness, numbness and headaches.  Hematological: Negative for adenopathy.  Psychiatric/Behavioral: Negative for agitation, confusion, sleep disturbance and suicidal ideas. The patient is not nervous/anxious.      Objective: BP 110/64   Ht 5\' 2"  (1.575 m)   Wt 183 lb (83 kg)   BMI 33.47 kg/m    Physical Exam Constitutional:      Appearance: She is well-developed.  Genitourinary:     Vagina and uterus normal.     No vaginal discharge, erythema or tenderness.     No cervical motion tenderness or polyp.     Uterus is not enlarged or tender.     No right or left adnexal mass present.     Right adnexa not tender.     Left adnexa not tender.  Neck:     Thyroid: No thyromegaly.  Cardiovascular:     Rate and Rhythm: Normal rate and regular rhythm.     Heart sounds: Normal heart sounds. No murmur.  Pulmonary:     Effort: Pulmonary effort is normal.     Breath sounds: Normal breath sounds.  Chest:     Breasts:        Right: No mass, nipple discharge, skin change or tenderness.        Left: No mass, nipple discharge, skin change or tenderness.  Abdominal:     Palpations: Abdomen is soft.     Tenderness: There is no abdominal tenderness. There is no guarding.  Musculoskeletal:        General: Normal range of motion.     Cervical back: Normal range of motion.  Neurological:     Mental Status: She is alert and oriented to person, place, and time.     Cranial Nerves: No cranial nerve deficit.  Psychiatric:        Behavior: Behavior normal.  Vitals reviewed.    Assessment/Plan: Encounter for annual routine gynecological examination  Cervical cancer screening - Plan: Cytology - PAP  Screening for STD (sexually transmitted disease) - Plan: Cytology - PAP  Trichomoniasis - Plan: Cytology - PAP; TOC today. Will  f/u if pos.  ASCUS with positive high risk HPV cervical - Plan: Cytology - PAP; repeat pap today.  Encounter for surveillance of implantable subdermal contraceptive--due for rem 7/23.    GYN counsel adequate intake of calcium and vitamin D, diet and exercise, tobacco cessation     F/U  Return in about 1 year (around 04/01/2021).  Gedalia Mcmillon B. Karver Fadden, PA-C 04/01/2020 11:22 AM

## 2020-04-03 LAB — CYTOLOGY - PAP
Chlamydia: NEGATIVE
Comment: NEGATIVE
Comment: NEGATIVE
Comment: NORMAL
Diagnosis: NEGATIVE
Neisseria Gonorrhea: NEGATIVE
Trichomonas: NEGATIVE

## 2020-10-30 ENCOUNTER — Ambulatory Visit
Admission: RE | Admit: 2020-10-30 | Discharge: 2020-10-30 | Disposition: A | Discharge status: Home / Self Care | Payer: Medicaid Other | Source: Ambulatory Visit | Attending: Family Medicine | Admitting: Family Medicine

## 2020-10-30 ENCOUNTER — Other Ambulatory Visit: Payer: Self-pay

## 2020-10-30 VITALS — BP 109/61 | HR 100 | Temp 98.7°F | Resp 18

## 2020-10-30 DIAGNOSIS — R21 Rash and other nonspecific skin eruption: Secondary | ICD-10-CM

## 2020-10-30 MED ORDER — TRIAMCINOLONE ACETONIDE 0.5 % EX OINT: 1.0000 "application " | TOPICAL_OINTMENT | Freq: Two times a day (BID) | CUTANEOUS | 0 refills | Status: DC

## 2020-10-30 MED ORDER — HYDROXYZINE HCL 25 MG PO TABS: 25.0000 mg | ORAL_TABLET | Freq: Three times a day (TID) | ORAL | 0 refills | Status: DC | PRN

## 2020-10-30 NOTE — ED Triage Notes (Signed)
Pt is here with right ankle swelling that started 2 days ago, pt has used Blue star ointment and Cortisone cream to relieve discomfort. Pt states her right index finger(finger #2) is swollen this started yesterday.

## 2020-10-30 NOTE — Discharge Instructions (Addendum)
Medication as prescribed.  Take care  Dr. Sylas Twombly  

## 2020-10-30 NOTE — ED Provider Notes (Signed)
[facility]    CSN: [id number] Arrival date & time: 10/30/20  6269      History   Chief Complaint Chief Complaint  Patient presents with  . (APPT 10AM) Swelling ankle   HPI  24 year old female presents with the above complaint.  Patient reports that she has some areas of redness of her right ankle/proximal foot.  Started a few days ago.  She states that the areas itch.  They're not particular painful.  She has used some of the counter ointments without resolution.  She also has a raised red area of her right index finger.  Mild swelling.  No known inciting factor.  No other associated symptoms.  No other complaints.  Past Medical History:  Diagnosis Date  . ASCUS with positive high risk HPV cervical 2018  . Hemorrhoids 2019  . Vitamin D deficiency     Patient Active Problem List   Diagnosis Date Noted  . Trichomoniasis 04/01/2020  . ASCUS with positive high risk HPV cervical 01/08/2019  . Breakthrough bleeding on Nexplanon 10/11/2018    Past Surgical History:  Procedure Laterality Date  . NO PAST SURGERIES      OB History    Gravida  0   Para  0   Term  0   Preterm  0   AB  0   Living  0     SAB  0   TAB  0   Ectopic  0   Multiple  0   Live Births  0            Home Medications    Prior to Admission medications   Medication Sig Start Date End Date Taking? Authorizing Provider  Fluoxetine HCl, PMDD, 20 MG TABS Take by mouth. 08/19/19  Yes [provider]  etonogestrel (IMPLANON) 68 MG IMPL implant 1 each (68 mg total) by Subdermal route once for 1 dose. 05/29/19 10/11/19  Copland, Ilona Sorrel, PA-C  hydrOXYzine (ATARAX/VISTARIL) 25 MG tablet Take 1 tablet (25 mg total) by mouth every 8 (eight) hours as needed for itching. 10/30/20   Tommie Sams, DO  triamcinolone ointment (KENALOG) 0.5 % Apply 1 application topically 2 (two) times daily. 10/30/20   Tommie Sams, DO  clonazePAM (KLONOPIN) 1 MG tablet 1/2 to 1 po q 12 hours  prn 03/01/19 10/11/19  [provider]  fluticasone (FLONASE) 50 MCG/ACT nasal spray Place 2 sprays into both nostrils daily. 09/04/18 03/05/20  Lutricia Feil, PA-C    Family History Family History  Problem Relation Age of Onset  . Hypertension Mother   . Diabetes Mother   . Hypertension Father   . Diabetes Father   . Colon cancer Other   . Diabetes Maternal Grandmother   . Hypertension Maternal Grandmother   . Stroke Maternal Grandmother   . Diabetes Maternal Grandfather     Social History Social History   Tobacco Use  . Smoking status: Current Every Day Smoker    Packs/day: 0.25    Years: 3.00    Pack years: 0.75    Types: Cigarettes  . Smokeless tobacco: Never Used  Vaping Use  . Vaping Use: Never used  Substance Use Topics  . Alcohol use: Yes  . Drug use: No     Allergies   Sulfamethoxazole-trimethoprim   Review of Systems Review of Systems  Skin:       Rash, itching.   Physical Exam Triage Vital Signs ED Triage Vitals  Enc  Vitals Group     BP 10/30/20 0944 109/61     Pulse Rate 10/30/20 0944 100     Resp 10/30/20 0944 18     Temp 10/30/20 0944 98.7 F (37.1 C)     Temp Source 10/30/20 0944 Oral     SpO2 10/30/20 0944 100 %     Weight --      Height --      Head Circumference --      Peak Flow --      Pain Score 10/30/20 0943 0     Pain Loc --      Pain Edu? --      Excl. in GC? --    Updated Vital Signs BP 109/61 (BP Location: Right Arm)   Pulse 100   Temp 98.7 F (37.1 C) (Oral)   Resp 18   LMP 09/30/2020   SpO2 100%   Visual Acuity Right Eye Distance:   Left Eye Distance:   Bilateral Distance:    Right Eye Near:   Left Eye Near:    Bilateral Near:     Physical Exam Vitals and nursing note reviewed.  Constitutional:      General: She is not in acute distress.    Appearance: Normal appearance. She is not ill-appearing.  HENT:     Head: Normocephalic and atraumatic.  Pulmonary:     Effort: Pulmonary effort is  normal. No respiratory distress.  Musculoskeletal:       Feet:  Feet:     Comments: Three flat areas of erythema noted of the proximal foot/distal ankle. Skin:    Comments: Right index finger with a localized area of erythema slight swelling.  Neurological:     Mental Status: She is alert.  Psychiatric:        Mood and Affect: Mood normal.        Behavior: Behavior normal.    UC Treatments / Results  Labs (all labs ordered are listed, but only abnormal results are displayed) Labs Reviewed - No data to display  EKG   Radiology No results found.  Procedures Procedures (including critical care time)  Medications Ordered in UC Medications - No data to display  Initial Impression / Assessment and Plan / UC Course  I have reviewed the triage vital signs and the nursing notes.  Pertinent labs & imaging results that were available during my care of the patient were reviewed by me and considered in my medical decision making (see chart for details).    24 year old female presents with rash. Treating with Triamcinolone and Atarax.   Final Clinical Impressions(s) / UC Diagnoses   Final diagnoses:  Rash     Discharge Instructions     Medication as prescribed.   Take care  Dr. Adriana Simas    ED Prescriptions    Medication Sig Dispense Auth. Provider   triamcinolone ointment (KENALOG) 0.5 % Apply 1 application topically 2 (two) times daily. 30 g Mackynzie Woolford G, DO   hydrOXYzine (ATARAX/VISTARIL) 25 MG tablet Take 1 tablet (25 mg total) by mouth every 8 (eight) hours as needed for itching. 30 tablet Tommie Sams, DO     PDMP not reviewed this encounter.   Tommie Sams, DO 10/30/20 1050

## 2020-12-31 ENCOUNTER — Emergency Department: Payer: Self-pay

## 2020-12-31 ENCOUNTER — Emergency Department
Admission: EM | Admit: 2020-12-31 | Discharge: 2020-12-31 | Disposition: A | Discharge status: Home / Self Care | Payer: Self-pay | Attending: Emergency Medicine | Admitting: Emergency Medicine

## 2020-12-31 ENCOUNTER — Telehealth: Payer: Self-pay | Admitting: Emergency Medicine

## 2020-12-31 ENCOUNTER — Other Ambulatory Visit: Payer: Self-pay

## 2020-12-31 DIAGNOSIS — R1013 Epigastric pain: Secondary | ICD-10-CM

## 2020-12-31 DIAGNOSIS — R112 Nausea with vomiting, unspecified: Secondary | ICD-10-CM

## 2020-12-31 DIAGNOSIS — R197 Diarrhea, unspecified: Secondary | ICD-10-CM

## 2020-12-31 DIAGNOSIS — F17210 Nicotine dependence, cigarettes, uncomplicated: Secondary | ICD-10-CM | POA: Insufficient documentation

## 2020-12-31 DIAGNOSIS — K529 Noninfective gastroenteritis and colitis, unspecified: Secondary | ICD-10-CM

## 2020-12-31 LAB — CBC WITH DIFFERENTIAL/PLATELET
Abs Immature Granulocytes: 0.09 10*3/uL — ABNORMAL HIGH (ref 0.00–0.07)
Basophils Absolute: 0.1 10*3/uL (ref 0.0–0.1)
Basophils Relative: 0 %
Eosinophils Absolute: 0.1 10*3/uL (ref 0.0–0.5)
Eosinophils Relative: 1 %
HCT: 43.7 % (ref 36.0–46.0)
Hemoglobin: 15 g/dL (ref 12.0–15.0)
Immature Granulocytes: 1 %
Lymphocytes Relative: 22 %
Lymphs Abs: 4.3 10*3/uL — ABNORMAL HIGH (ref 0.7–4.0)
MCH: 31.6 pg (ref 26.0–34.0)
MCHC: 34.3 g/dL (ref 30.0–36.0)
MCV: 92 fL (ref 80.0–100.0)
Monocytes Absolute: 1 10*3/uL (ref 0.1–1.0)
Monocytes Relative: 5 %
Neutro Abs: 13.9 10*3/uL — ABNORMAL HIGH (ref 1.7–7.7)
Neutrophils Relative %: 71 %
Platelets: 249 10*3/uL (ref 150–400)
RBC: 4.75 MIL/uL (ref 3.87–5.11)
RDW: 12.3 % (ref 11.5–15.5)
WBC: 19.4 10*3/uL — ABNORMAL HIGH (ref 4.0–10.5)
nRBC: 0 % (ref 0.0–0.2)

## 2020-12-31 LAB — COMPREHENSIVE METABOLIC PANEL
ALT: 15 U/L (ref 0–44)
AST: 21 U/L (ref 15–41)
Albumin: 4.8 g/dL (ref 3.5–5.0)
Alkaline Phosphatase: 36 U/L — ABNORMAL LOW (ref 38–126)
Anion gap: 12 (ref 5–15)
BUN: 15 mg/dL (ref 6–20)
CO2: 19 mmol/L — ABNORMAL LOW (ref 22–32)
Calcium: 9.9 mg/dL (ref 8.9–10.3)
Chloride: 108 mmol/L (ref 98–111)
Creatinine, Ser: 0.92 mg/dL (ref 0.44–1.00)
GFR, Estimated: >60 mL/min (ref >60)
Glucose, Bld: 153 mg/dL — ABNORMAL HIGH (ref 70–99)
Potassium: 3.6 mmol/L (ref 3.5–5.1)
Sodium: 139 mmol/L (ref 135–145)
Total Bilirubin: 0.8 mg/dL (ref 0.3–1.2)
Total Protein: 8.3 g/dL — ABNORMAL HIGH (ref 6.5–8.1)

## 2020-12-31 LAB — URINALYSIS, ROUTINE W REFLEX MICROSCOPIC
Bilirubin Urine: NEGATIVE
Glucose, UA: NEGATIVE mg/dL
Ketones, ur: 20 mg/dL — AB
Leukocytes,Ua: NEGATIVE
Nitrite: NEGATIVE
Protein, ur: 100 mg/dL — AB
Specific Gravity, Urine: 1.031 — ABNORMAL HIGH (ref 1.005–1.030)
pH: 5 (ref 5.0–8.0)

## 2020-12-31 LAB — LACTIC ACID, PLASMA
Lactic Acid, Venous: 2.2 mmol/L (ref 0.5–1.9)
Lactic Acid, Venous: 2.3 mmol/L (ref 0.5–1.9)

## 2020-12-31 LAB — ETHANOL: Alcohol, Ethyl (B): <10 mg/dL (ref <10)

## 2020-12-31 LAB — LIPASE, BLOOD: Lipase: 36 U/L (ref 11–51)

## 2020-12-31 LAB — POC URINE PREG, ED: Preg Test, Ur: NEGATIVE

## 2020-12-31 MED ORDER — DICYCLOMINE HCL 10 MG PO CAPS
10.0000 mg | ORAL_CAPSULE | Freq: Once | ORAL | Status: AC
  Administered 2020-12-31: 10 mg via ORAL
  Filled 2020-12-31: qty 1

## 2020-12-31 MED ORDER — OMEPRAZOLE MAGNESIUM 20 MG PO TBEC: 20.0000 mg | DELAYED_RELEASE_TABLET | Freq: Every day | ORAL | 1 refills | Status: DC

## 2020-12-31 MED ORDER — PANTOPRAZOLE SODIUM 40 MG IV SOLR
40.0000 mg | Freq: Once | INTRAVENOUS | Status: AC
  Administered 2020-12-31: 40 mg via INTRAVENOUS
  Filled 2020-12-31: qty 40

## 2020-12-31 MED ORDER — ONDANSETRON HCL 4 MG/2ML IJ SOLN
4.0000 mg | INTRAMUSCULAR | Status: AC
  Administered 2020-12-31: 4 mg via INTRAVENOUS
  Filled 2020-12-31: qty 2

## 2020-12-31 MED ORDER — SUCRALFATE 1 G PO TABS: 1.0000 g | ORAL_TABLET | Freq: Four times a day (QID) | ORAL | 1 refills | Status: DC | PRN

## 2020-12-31 MED ORDER — LACTATED RINGERS IV BOLUS
1000.0000 mL | Freq: Once | INTRAVENOUS | Status: AC
  Administered 2020-12-31: 1000 mL via INTRAVENOUS

## 2020-12-31 MED ORDER — ONDANSETRON 4 MG PO TBDP: ORAL_TABLET | ORAL | 0 refills | Status: DC

## 2020-12-31 MED ORDER — MORPHINE SULFATE (PF) 4 MG/ML IV SOLN
4.0000 mg | Freq: Once | INTRAVENOUS | Status: AC
  Administered 2020-12-31: 4 mg via INTRAVENOUS
  Filled 2020-12-31: qty 1

## 2020-12-31 NOTE — ED Notes (Signed)
pT

## 2020-12-31 NOTE — Discharge Instructions (Addendum)

## 2020-12-31 NOTE — ED Provider Notes (Signed)
[facility] Emergency Department Provider Note  ____________________________________________   Event Date/Time   First MD Initiated Contact with Patient 12/31/20 [phone]     (approximate)  I have reviewed the triage vital signs and the nursing notes.   HISTORY  Chief Complaint Nausea and Emesis    HPI Kathy Romero is a 25 y.o. female    with noncontributory past medical history presents by EMS for evaluation of acute onset upper abdominal pain, nausea, vomiting, and some diarrhea.  She says that she felt fine before bed but then woke up and ate a sandwich and then immediately became ill.  She had some alcohol last night although she says it was not very much.  The symptoms have never happened to her in the past.  She has had numerous episodes of nausea and vomiting and a couple of episodes of loose stool.  The pain is in the middle of her upper abdomen, is constant and aching, nothing in particular makes it better or worse.  She has never had abdominal surgery.  She has no dysuria and no vaginal complaints or concerns.  No lower abdominal or pelvic pain.  She denies fever/chills, sore throat, chest pain, shortness of breath, and cough.  The onset was acute and the symptoms are severe.        Past Medical History:  Diagnosis Date  . ASCUS with positive high risk HPV cervical 2018  . Hemorrhoids 2019  . Vitamin D deficiency     Patient Active Problem List   Diagnosis Date Noted  . Trichomoniasis 04/01/2020  . ASCUS with positive high risk HPV cervical 01/08/2019  . Breakthrough bleeding on Nexplanon 10/11/2018    Past Surgical History:  Procedure Laterality Date  . NO PAST SURGERIES      Prior to Admission medications   Medication Sig Start Date End Date Taking? Authorizing Provider  omeprazole (PRILOSEC OTC) 20 MG tablet Take 1 tablet (20 mg total) by mouth daily. 12/31/20 12/31/21 Yes Loleta Rose, MD  ondansetron (ZOFRAN ODT) 4 MG  disintegrating tablet Allow 1-2 tablets to dissolve in your mouth every 8 hours as needed for nausea/vomiting 12/31/20  Yes Loleta Rose, MD  sucralfate (CARAFATE) 1 g tablet Take 1 tablet (1 g total) by mouth 4 (four) times daily as needed (for abdominal discomfort, nausea, and/or vomiting). 12/31/20  Yes Loleta Rose, MD  etonogestrel (IMPLANON) 68 MG IMPL implant 1 each (68 mg total) by Subdermal route once for 1 dose. 05/29/19 10/11/19  Copland, Ilona Sorrel, PA-C  Fluoxetine HCl, PMDD, 20 MG TABS Take by mouth. 08/19/19   [provider]  hydrOXYzine (ATARAX/VISTARIL) 25 MG tablet Take 1 tablet (25 mg total) by mouth every 8 (eight) hours as needed for itching. 10/30/20   Tommie Sams, DO  triamcinolone ointment (KENALOG) 0.5 % Apply 1 application topically 2 (two) times daily. 10/30/20   Tommie Sams, DO  clonazePAM (KLONOPIN) 1 MG tablet 1/2 to 1 po q 12 hours prn 03/01/19 10/11/19  [provider]  fluticasone (FLONASE) 50 MCG/ACT nasal spray Place 2 sprays into both nostrils daily. 09/04/18 03/05/20  Lutricia Feil, PA-C    Allergies Sulfamethoxazole-trimethoprim  Family History  Problem Relation Age of Onset  . Hypertension Mother   . Diabetes Mother   . Hypertension Father   . Diabetes Father   . Colon cancer Other   . Diabetes Maternal Grandmother   . Hypertension Maternal Grandmother   . Stroke Maternal Grandmother   .  Diabetes Maternal Grandfather     Social History Social History   Tobacco Use  . Smoking status: Current Every Day Smoker    Packs/day: 0.25    Years: 3.00    Pack years: 0.75    Types: Cigarettes  . Smokeless tobacco: Never Used  Vaping Use  . Vaping Use: Never used  Substance Use Topics  . Alcohol use: Yes  . Drug use: No    Review of Systems Constitutional: No fever/chills Eyes: No visual changes. ENT: No sore throat. Cardiovascular: Denies chest pain. Respiratory: Denies shortness of breath. Gastrointestinal: Upper abdominal  pain, nausea, vomiting, and diarrhea as described above. Genitourinary: Negative for dysuria. Musculoskeletal: Negative for neck pain.  Negative for back pain. Integumentary: Negative for rash. Neurological: Negative for headaches, focal weakness or numbness.   ____________________________________________   PHYSICAL EXAM:  VITAL SIGNS: ED Triage Vitals  Enc Vitals Group     BP --      Pulse Rate 12/31/20 0548 82     Resp 12/31/20 0548 19     Temp --      Temp src --      SpO2 12/31/20 0548 100 %     Weight 12/31/20 0549 81.6 kg (180 lb)     Height 12/31/20 0549 1.575 m (5\' 2" )     Head Circumference --      Peak Flow --      Pain Score 12/31/20 0549 9     Pain Loc --      Pain Edu? --      Excl. in GC? --     Constitutional: Alert and oriented.  Appears uncomfortable. Eyes: Conjunctivae are normal.  Head: Atraumatic. Nose: No congestion/rhinnorhea. Mouth/Throat: Patient is wearing a mask. Neck: No stridor.  No meningeal signs.   Cardiovascular: Normal rate, regular rhythm. Good peripheral circulation. Respiratory: Normal respiratory effort.  No retractions. Gastrointestinal: Soft with tenderness to palpation in the epigastrium and right upper quadrant with equivocal Murphy sign.  No lower abdominal tenderness, no rebound or guarding. Musculoskeletal: No lower extremity tenderness nor edema. No gross deformities of extremities. Neurologic:  Normal speech and language. No gross focal neurologic deficits are appreciated.  Skin:  Skin is warm, dry and intact. Psychiatric: Mood and affect are normal. Speech and behavior are normal.  ____________________________________________   LABS (all labs ordered are listed, but only abnormal results are displayed)  Labs Reviewed  COMPREHENSIVE METABOLIC PANEL - Abnormal; Notable for the following components:      Result Value   CO2 19 (*)    Glucose, Bld 153 (*)    Total Protein 8.3 (*)    Alkaline Phosphatase 36 (*)    All  other components within normal limits  CBC WITH DIFFERENTIAL/PLATELET - Abnormal; Notable for the following components:   WBC 19.4 (*)    Neutro Abs 13.9 (*)    Lymphs Abs 4.3 (*)    Abs Immature Granulocytes 0.09 (*)    All other components within normal limits  LIPASE, BLOOD  ETHANOL  URINALYSIS, ROUTINE W REFLEX MICROSCOPIC  LACTIC ACID, PLASMA  LACTIC ACID, PLASMA  POC URINE PREG, ED   ____________________________________________  EKG  No indication for emergent EKG ____________________________________________  RADIOLOGY I, 02/28/21, personally viewed and evaluated these images (plain radiographs) as part of my medical decision making, as well as reviewing the written report by the radiologist.  ED MD interpretation: No acute abnormalities identified on ultrasound  Official radiology report(s): Loleta Rose ABDOMEN LIMITED RUQ (LIVER/GB)  Result Date: 12/31/2020 CLINICAL DATA:  Acute onset epigastric pain, nausea, vomiting and diarrhea for 4 hours EXAM: ULTRASOUND ABDOMEN LIMITED RIGHT UPPER QUADRANT COMPARISON:  None. FINDINGS: Gallbladder: No gallstones or wall thickening visualized. No sonographic Murphy sign noted by sonographer. Common bile duct: Diameter: 3.7 mm, nondilated Liver: No focal liver lesion. Normal hepatic echogenicity and echotexture. No intrahepatic biliary ductal dilatation. Portal vein is patent on color Doppler imaging with normal direction of blood flow towards the liver. Other: None. IMPRESSION: Unremarkable right upper quadrant ultrasound. Electronically Signed   By: Kreg Shropshire M.D.   On: 12/31/2020 06:47    ____________________________________________   PROCEDURES   Procedure(s) performed (including Critical Care):  Procedures   ____________________________________________   INITIAL IMPRESSION / MDM / ASSESSMENT AND PLAN / ED COURSE  As part of my medical decision making, I reviewed the following data within the electronic MEDICAL RECORD NUMBER  History obtained from family, Nursing notes reviewed and incorporated, Labs reviewed , Old chart reviewed, Patient signed out to Dr. Antoine Primas, reviewed Notes from prior ED visits and [place] Controlled Substance Database   Differential diagnosis includes, but is not limited to, alcohol induced gastritis, GERD, biliary colic, cholecystitis, pancreatitis, SBO/ileus, cannabinoid hyperemesis syndrome or other nonspecific cyclic vomiting syndrome.  The patient's symptoms are most consistent with gastritis or biliary colic.  Vital signs are stable and within normal limits.  Patient appears quite uncomfortable and has tenderness to palpation of the epigastrium and right upper quadrant though with a somewhat equivocal Murphy sign.  I have ordered morphine 4 mg IV and Zofran 4 mg IV.  Awaiting lab work including CBC, CMP, lipase, ethanol level.  Patient says she is on birth control and there is no chance she could be pregnant but we will check a urine pregnancy if given the opportunity for treatment planning purposes.  I also ordered a right upper quadrant ultrasound to evaluate her gallbladder and liver.  Patient understands and agrees with the plan.       Clinical Course as of 12/31/20 0711  Thu Dec 31, 2020  0624 WBC(!): 19.4 Nonspecific leukocytosis but this is a fairly substantial white blood cell count elevation and even if it is reactive in the setting of an acute gastritis.  Again the patient is not tachycardic nor febrile.  I will check a lactic acid and provide 1 L lactated Ringer's. [CF]  6294 I reassessed the patient and she is feeling better although she is still somewhat symptomatic.  I reexamined her abdomen and she has no tenderness to palpation of the lower abdomen at all including no rebound and no guarding.  She continues to have mild tenderness of the epigastrium but no right upper quadrant tenderness.  This seems like mostly an acute gastritis but with some diarrhea as well.  Unsure if  foodborne or viral, regardless there is no evidence of emergent medical condition that requires hospitalization or surgical intervention.  I ordered pantoprazole 40 mg IV and Bentyl 10 mg p.o.  Lactic acid is still pending.  Transferring ED care to Dr. Katrinka Blazing to follow-up on the results of the lactic acid and to reassess if the patient is ready to go home. [CF]  0636 Alcohol, Ethyl (B): <10 [CF]  7654 Comprehensive metabolic panel(!) Essentially normal comprehensive metabolic panel including LFTs and anion gap.  CO2 is slightly decreased but not substantially so. [CF]    Clinical Course User Index [CF] Loleta Rose, MD     ____________________________________________  FINAL CLINICAL IMPRESSION(S) /  ED DIAGNOSES  Final diagnoses:  Epigastric pain  Nausea vomiting and diarrhea  Acute gastroenteritis     MEDICATIONS GIVEN DURING THIS VISIT:  Medications  pantoprazole (PROTONIX) injection 40 mg (has no administration in time range)  dicyclomine (BENTYL) capsule 10 mg (has no administration in time range)  morphine 4 MG/ML injection 4 mg (4 mg Intravenous Given 12/31/20 0559)  ondansetron (ZOFRAN) injection 4 mg (4 mg Intravenous Given 12/31/20 0556)  lactated ringers bolus 1,000 mL (1,000 mLs Intravenous New Bag/Given 12/31/20 0646)     ED Discharge Orders         Ordered    omeprazole (PRILOSEC OTC) 20 MG tablet  Daily        12/31/20 0709    sucralfate (CARAFATE) 1 g tablet  4 times daily PRN        12/31/20 0709    ondansetron (ZOFRAN ODT) 4 MG disintegrating tablet        12/31/20 0709          *Please note:  LAURABELLE GARAND was evaluated in Emergency Department on 12/31/2020 for the symptoms described in the history of present illness. She was evaluated in the context of the global COVID-19 pandemic, which necessitated consideration that the patient might be at risk for infection with the SARS-CoV-2 virus that causes COVID-19. Institutional protocols and algorithms that  pertain to the evaluation of patients at risk for COVID-19 are in a state of rapid change based on information released by regulatory bodies including the CDC and federal and state organizations. These policies and algorithms were followed during the patient's care in the ED.  Some ED evaluations and interventions may be delayed as a result of limited staffing during and after the pandemic.*  Note:  This document was prepared using Dragon voice recognition software and may include unintentional dictation errors.   Loleta Rose, MD 12/31/20 [phone]

## 2020-12-31 NOTE — ED Notes (Signed)
Date and time results received: 12/31/20 0725 (use smartphrase ".now" to insert current time)  Test: Lactic Critical Value: 2.2  Name of Provider Notified: Katrinka Blazing  Orders Received? Or Actions Taken?: Actions Taken: Meds given, reasses lactic after IVF bolus

## 2020-12-31 NOTE — ED Notes (Signed)
New IV placed and wrapped with coban to keep in place.

## 2020-12-31 NOTE — ED Triage Notes (Signed)
Pt from home via [facility] with nausea, vomiting, and diarrhea. Pt reports symptoms began at 0200. Reports multiple episodes of emesis. A&Ox4.

## 2020-12-31 NOTE — Telephone Encounter (Signed)
Critical lactic 2.3, EDP Z Smith notified, no further orders given at this time.

## 2020-12-31 NOTE — ED Notes (Signed)
Pt states she feels much better, pt given education on bland diet. Pt states understanding. Pt wheeled to lobby.

## 2020-12-31 NOTE — ED Notes (Signed)
Pt placed on bedpan. Episode of liquid stool.

## 2020-12-31 NOTE — ED Notes (Signed)
Per EDP pt can have po fluid and food. Pt given soda and crackers per request, pt tolerating well.

## 2020-12-31 NOTE — ED Provider Notes (Signed)
I assumed care of this patient approximately 0 700.  Please see [facility] providers note for full details regarding patient's initial evaluation assessment.  In brief patient presents for assessment of some acute upper epigastric abdominal pain associate with some nausea vomiting and diarrhea in the setting of some binge drinking last night.  Patient is afebrile hemodynamically stable arrival.  Her abdomen is soft nontender throughout with exception of some very mild discomfort in the left upper quadrant.  No tenderness in the right lower quadrant, left lower quadrant no CVA tenderness.  He has not peritonitis and there is no rebound tenderness or guarding.  These findings were confirmed on my assessment.  Labs remarkable for elevated white blood cell count which is suspect is reactive in the setting of nausea vomiting diarrhea as well as mildly elevated lactate just above normal limits were suspected secondary to some mild volume loss.  Patient was treated with antiemetics IV fluids noted below.  On my assessment she stated she felt better and continued to have a very soft abdomen. While UA does have some WBCs and bacteria patient denies any urinary symptoms and has no fever or CVA tenderness I will suspicion for acute urinary tract infection this time. Will defer antibiotics at this time. Given improvement in cough patient is feeling with otherwise stable vitals and reassuring work-up I believe patient is safe for discharge with plan for outpatient follow-up. Discharged stable condition. Strict return precautions advised and discussed. Advised patient to return immediately to the emergency room should she experience any new pains or acute worsening of her current pain. Advised she return if she is unable to keep fluids down or develops a fever or any new symptoms. Patient and mother voiced understanding and agreement this plan.  Medications  morphine 4 MG/ML injection 4 mg (4 mg Intravenous Given 12/31/20 0559)   ondansetron (ZOFRAN) injection 4 mg (4 mg Intravenous Given 12/31/20 0556)  lactated ringers bolus 1,000 mL (1,000 mLs Intravenous New Bag/Given 12/31/20 0646)  pantoprazole (PROTONIX) injection 40 mg (40 mg Intravenous Given 12/31/20 0724)  dicyclomine (BENTYL) capsule 10 mg (10 mg Oral Given 12/31/20 0722)      Gilles Chiquito, MD 12/31/20 [phone]

## 2020-12-31 NOTE — ED Notes (Signed)
Pt helped to toilet, gait steady, pt's mother in room.

## 2021-01-01 LAB — URINE CULTURE

## 2021-03-31 ENCOUNTER — Other Ambulatory Visit: Payer: Self-pay

## 2021-03-31 ENCOUNTER — Ambulatory Visit
Admission: RE | Admit: 2021-03-31 | Discharge: 2021-03-31 | Disposition: A | Discharge status: Home / Self Care | Payer: 59 | Source: Ambulatory Visit | Attending: Internal Medicine | Admitting: Internal Medicine

## 2021-03-31 VITALS — BP 129/86 | HR 71 | Temp 99.1°F | Resp 16

## 2021-03-31 DIAGNOSIS — J029 Acute pharyngitis, unspecified: Secondary | ICD-10-CM | POA: Diagnosis not present

## 2021-03-31 LAB — POCT RAPID STREP A (OFFICE): Rapid Strep A Screen: NEGATIVE

## 2021-03-31 NOTE — ED Triage Notes (Signed)
Patient c/o sore throat x 1 week.   Patient denies fever.   Patient endorses sinus pressure at times.   Patient endorses tenderness upon palpation of neck. Patient endorses painful swallowing.   Patient had a PCR COVID test done Monday with a negative test result.   Patient has tried OTC medications with no relief of symptoms.

## 2021-03-31 NOTE — Discharge Instructions (Addendum)
Please try warm salt water gargle Continue Flonase use Increase oral fluid intake Smoke cessation is recommended Return to urgent care or follow-up with primary care physician if symptoms are persistent.

## 2021-03-31 NOTE — ED Provider Notes (Signed)
Renaldo Fiddler    CSN: [id number] Arrival date & time: 03/31/21  3149      History   Chief Complaint Chief Complaint  Patient presents with  . Sore Throat  . Appointment     HPI Kathy Romero is a 25 y.o. female comes to urgent care with complaints of a sore throat, sinus pressure of 1 week duration.  Patient endorses an insidious onset of symptoms.  No fever or chills.  Patient had a negative COVID test on Monday.  She also endorses pain on swallowing as well as discomfort on the right side of the neck.  No nausea, vomiting or diarrhea.  No sick contacts.  Patient has tried over-the-counter medications with no relief.   HPI  Past Medical History:  Diagnosis Date  . ASCUS with positive high risk HPV cervical 2018  . Hemorrhoids 2019  . Vitamin D deficiency     Patient Active Problem List   Diagnosis Date Noted  . Trichomoniasis 04/01/2020  . ASCUS with positive high risk HPV cervical 01/08/2019  . Breakthrough bleeding on Nexplanon 10/11/2018    Past Surgical History:  Procedure Laterality Date  . NO PAST SURGERIES      OB History    Gravida  0   Para  0   Term  0   Preterm  0   AB  0   Living  0     SAB  0   IAB  0   Ectopic  0   Multiple  0   Live Births  0            Home Medications    Prior to Admission medications   Medication Sig Start Date End Date Taking? Authorizing Provider  etonogestrel (IMPLANON) 68 MG IMPL implant 1 each (68 mg total) by Subdermal route once for 1 dose. 05/29/19 10/11/19  Copland, Ilona Sorrel, PA-C  Fluoxetine HCl, PMDD, 20 MG TABS Take by mouth. 08/19/19   [provider]  omeprazole (PRILOSEC OTC) 20 MG tablet Take 1 tablet (20 mg total) by mouth daily. 12/31/20 12/31/21  Loleta Rose, MD  ondansetron (ZOFRAN ODT) 4 MG disintegrating tablet Allow 1-2 tablets to dissolve in your mouth every 8 hours as needed for nausea/vomiting 12/31/20   Loleta Rose, MD  sucralfate (CARAFATE) 1 g tablet Take 1  tablet (1 g total) by mouth 4 (four) times daily as needed (for abdominal discomfort, nausea, and/or vomiting). 12/31/20   Loleta Rose, MD  clonazePAM Scarlette Calico) 1 MG tablet 1/2 to 1 po q 12 hours prn 03/01/19 10/11/19  [provider]  fluticasone (FLONASE) 50 MCG/ACT nasal spray Place 2 sprays into both nostrils daily. 09/04/18 03/05/20  Lutricia Feil, PA-C  topiramate (TOPAMAX) 25 MG tablet Take 25 mg by mouth daily. 12/18/20 03/31/21  [provider]    Family History Family History  Problem Relation Age of Onset  . Hypertension Mother   . Diabetes Mother   . Hypertension Father   . Diabetes Father   . Colon cancer Other   . Diabetes Maternal Grandmother   . Hypertension Maternal Grandmother   . Stroke Maternal Grandmother   . Diabetes Maternal Grandfather     Social History Social History   Tobacco Use  . Smoking status: Current Some Day Smoker    Packs/day: 0.25    Years: 3.00    Pack years: 0.75    Types: Cigarettes  . Smokeless tobacco: Never Used  Vaping Use  .  Vaping Use: Never used  Substance Use Topics  . Alcohol use: Yes  . Drug use: No     Allergies   Sulfamethoxazole-trimethoprim   Review of Systems Review of Systems  HENT: Positive for congestion, sinus pressure and sore throat. Negative for sinus pain.   Respiratory: Negative.   Cardiovascular: Negative.   Genitourinary: Negative.   Neurological: Negative.      Physical Exam Triage Vital Signs ED Triage Vitals  Enc Vitals Group     BP 03/31/21 0912 129/86     Pulse Rate 03/31/21 0912 71     Resp 03/31/21 0912 16     Temp 03/31/21 0912 99.1 F (37.3 C)     Temp Source 03/31/21 0912 Oral     SpO2 03/31/21 0912 99 %     Weight --      Height --      Head Circumference --      Peak Flow --      Pain Score 03/31/21 0909 7     Pain Loc --      Pain Edu? --      Excl. in GC? --    No data found.  Updated Vital Signs BP 129/86 (BP Location: Left Arm)   Pulse 71   Temp  99.1 F (37.3 C) (Oral)   Resp 16   LMP 03/15/2021   SpO2 99%   Visual Acuity Right Eye Distance:   Left Eye Distance:   Bilateral Distance:    Right Eye Near:   Left Eye Near:    Bilateral Near:     Physical Exam Vitals and nursing note reviewed.  Constitutional:      General: She is not in acute distress.    Appearance: She is not ill-appearing.  HENT:     Mouth/Throat:     Mouth: Mucous membranes are moist.     Pharynx: No oropharyngeal exudate or posterior oropharyngeal erythema.     Tonsils: No tonsillar exudate or tonsillar abscesses. 0 on the right. 0 on the left.  Cardiovascular:     Rate and Rhythm: Normal rate and regular rhythm.  Pulmonary:     Effort: Pulmonary effort is normal.     Breath sounds: Normal breath sounds.  Abdominal:     General: Bowel sounds are normal.     Palpations: Abdomen is soft.  Skin:    General: Skin is warm and dry.  Neurological:     Mental Status: She is alert.      UC Treatments / Results  Labs (all labs ordered are listed, but only abnormal results are displayed) Labs Reviewed  POCT RAPID STREP A (OFFICE)    EKG   Radiology No results found.  Procedures Procedures (including critical care time)  Medications Ordered in UC Medications - No data to display  Initial Impression / Assessment and Plan / UC Course  I have reviewed the triage vital signs and the nursing notes.  Pertinent labs & imaging results that were available during my care of the patient were reviewed by me and considered in my medical decision making (see chart for details).     1.  Acute pharyngitis: Increase oral fluid intake Smoke cessation advised Point-of-care strep test is negative Smoke cessation counseling given.  Patient is in the contemplative state of smoke cessation.  Time spent on smoking cessation discussion is less than 10 minutes. Return precautions given Final Clinical Impressions(s) / UC Diagnoses   Final diagnoses:   Acute pharyngitis, unspecified  etiology     Discharge Instructions     Please try warm salt water gargle Continue Flonase use Increase oral fluid intake Smoke cessation is recommended Return to urgent care or follow-up with primary care physician if symptoms are persistent.   ED Prescriptions    None     PDMP not reviewed this encounter.   Merrilee Jansky, MD 03/31/21 1520

## 2021-04-05 ENCOUNTER — Encounter: Payer: Self-pay | Admitting: Obstetrics and Gynecology

## 2021-04-05 ENCOUNTER — Other Ambulatory Visit (HOSPITAL_COMMUNITY)
Admission: RE | Admit: 2021-04-05 | Discharge: 2021-04-05 | Disposition: A | Discharge status: Home / Self Care | Payer: 59 | Source: Ambulatory Visit | Attending: Obstetrics and Gynecology | Admitting: Obstetrics and Gynecology

## 2021-04-05 ENCOUNTER — Other Ambulatory Visit: Payer: Self-pay

## 2021-04-05 ENCOUNTER — Ambulatory Visit (INDEPENDENT_AMBULATORY_CARE_PROVIDER_SITE_OTHER): Payer: Medicaid Other | Admitting: Obstetrics and Gynecology

## 2021-04-05 VITALS — BP 124/90 | Ht 62.0 in | Wt 183.0 lb

## 2021-04-05 DIAGNOSIS — Z113 Encounter for screening for infections with a predominantly sexual mode of transmission: Secondary | ICD-10-CM | POA: Diagnosis not present

## 2021-04-05 DIAGNOSIS — Z01419 Encounter for gynecological examination (general) (routine) without abnormal findings: Secondary | ICD-10-CM | POA: Diagnosis not present

## 2021-04-05 DIAGNOSIS — Z124 Encounter for screening for malignant neoplasm of cervix: Secondary | ICD-10-CM | POA: Diagnosis present

## 2021-04-05 DIAGNOSIS — R87610 Atypical squamous cells of undetermined significance on cytologic smear of cervix (ASC-US): Secondary | ICD-10-CM | POA: Insufficient documentation

## 2021-04-05 DIAGNOSIS — Z3046 Encounter for surveillance of implantable subdermal contraceptive: Secondary | ICD-10-CM

## 2021-04-05 DIAGNOSIS — R87810 Cervical high risk human papillomavirus (HPV) DNA test positive: Secondary | ICD-10-CM | POA: Diagnosis present

## 2021-04-05 NOTE — Patient Instructions (Signed)
I value your feedback and you entrusting us with your care. If you get a [facility] patient survey, I would appreciate you taking the time to let us know about your experience today. Thank you! ? ? ?

## 2021-04-05 NOTE — Progress Notes (Addendum)
PCP:  [facility]   Chief Complaint  Patient presents with  . Gynecologic Exam    Undecided about nexplanon, needs a break     HPI:      [name] is a 25 y.o. G0P0000 whose LMP was Patient's last menstrual period was 03/15/2021 (approximate)., presents today for her annual examination.  Her menses are absent with nexplanon, has occas BTB. Dysmenorrhea none.   Sex activity: currently active - Contraception nexplanon, Replaced 05/29/19. May want removed but unsure. Ok for now.  Last pap: 01/08/19 Results: normal/neg HPV DNA. Pap 2019 was ASCUS with POS HPV DNA. Repeat pap due today. Hx of STDs: HPV on pap; trich 4/21 at ED treated with flagyl; due for TOC today. Sx resolved, not sex active since tx.  There is a new FH of breast cancer in her MGM, genetic testing not indicated. There is no FH of ovarian cancer. The patient  does not do self-breast exams.  Tobacco use: trying to quit vaping Alcohol use: social No drug use.  Exercise: moderately active  She does get adequate calcium and Vitamin D in her diet.  Gardasil not completed.    Past Medical History:  Diagnosis Date  . Anxiety   . ASCUS with positive high risk HPV cervical 2018  . Hemorrhoids 2019  . Vitamin D deficiency     Past Surgical History:  Procedure Laterality Date  . NO PAST SURGERIES      Family History  Problem Relation Age of Onset  . Hypertension Mother   . Diabetes Mother   . Hypertension Father   . Diabetes Father   . Colon cancer Other   . Diabetes Maternal Grandmother   . Hypertension Maternal Grandmother   . Stroke Maternal Grandmother   . Breast cancer Maternal Grandmother 80  . Diabetes Maternal Grandfather     Social History   Socioeconomic History  . Marital status: Single    Spouse name: Not on file  . Number of children: Not on file  . Years of education: Not on file  . Highest education level: Not on file  Occupational History  . Not on file   Tobacco Use  . Smoking status: Current Some Day Smoker    Packs/day: 0.25    Years: 3.00    Pack years: 0.75    Types: Cigarettes  . Smokeless tobacco: Never Used  Vaping Use  . Vaping Use: Never used  Substance and Sexual Activity  . Alcohol use: Yes  . Drug use: No  . Sexual activity: Yes    Birth control/protection: Implant  Other Topics Concern  . Not on file  Social History Narrative  . Not on file   Social Determinants of Health   Financial Resource Strain: Not on file  Food Insecurity: Not on file  Transportation Needs: Not on file  Physical Activity: Not on file  Stress: Not on file  Social Connections: Not on file  Intimate Partner Violence: Not on file    Current Meds  Medication Sig  . Azelastine HCl 137 MCG/SPRAY SOLN Place into both nostrils.  . Vitamin D, Ergocalciferol, (DRISDOL) 1.25 MG (50000 UNIT) CAPS capsule Take by mouth.     ROS:  Review of Systems  Constitutional: Negative for fatigue, fever and unexpected weight change.  Respiratory: Negative for cough, shortness of breath and wheezing.   Cardiovascular: Negative for chest pain, palpitations and leg swelling.  Gastrointestinal: Negative for blood in stool, constipation, diarrhea, nausea  and vomiting.  Endocrine: Negative for cold intolerance, heat intolerance and polyuria.  Genitourinary: Negative for dyspareunia, dysuria, flank pain, frequency, genital sores, hematuria, menstrual problem, pelvic pain, urgency, vaginal bleeding, vaginal discharge and vaginal pain.  Musculoskeletal: Negative for arthralgias, back pain, joint swelling and myalgias.  Skin: Negative for rash.  Neurological: Positive for headaches. Negative for dizziness, syncope, light-headedness and numbness.  Hematological: Negative for adenopathy.  Psychiatric/Behavioral: Negative for agitation, confusion, sleep disturbance and suicidal ideas. The patient is not nervous/anxious.      Objective: BP 124/90   Ht 5\' 2"   (1.575 m)   Wt 183 lb (83 kg)   LMP 03/15/2021 (Approximate)   BMI 33.47 kg/m    Physical Exam Constitutional:      Appearance: She is well-developed.  Genitourinary:     Vulva normal.     Right Labia: No rash, tenderness or lesions.    Left Labia: No tenderness, lesions or rash.    No vaginal discharge, erythema or tenderness.      Right Adnexa: not tender and no mass present.    Left Adnexa: not tender and no mass present.    No cervical motion tenderness, friability or polyp.     Uterus is not enlarged or tender.  Breasts:     Right: No mass, nipple discharge, skin change or tenderness.     Left: No mass, nipple discharge, skin change or tenderness.    Neck:     Thyroid: No thyromegaly.  Cardiovascular:     Rate and Rhythm: Normal rate and regular rhythm.     Heart sounds: Normal heart sounds. No murmur heard.   Pulmonary:     Effort: Pulmonary effort is normal.     Breath sounds: Normal breath sounds.  Abdominal:     Palpations: Abdomen is soft.     Tenderness: There is no abdominal tenderness. There is no guarding or rebound.  Musculoskeletal:        General: Normal range of motion.     Cervical back: Normal range of motion.  Lymphadenopathy:     Cervical: No cervical adenopathy.  Neurological:     General: No focal deficit present.     Mental Status: She is alert and oriented to person, place, and time.     Cranial Nerves: No cranial nerve deficit.  Skin:    General: Skin is warm and dry.  Psychiatric:        Mood and Affect: Mood normal.        Behavior: Behavior normal.        Thought Content: Thought content normal.        Judgment: Judgment normal.  Vitals reviewed.    Assessment/Plan: Encounter for annual routine gynecological examination  Cervical cancer screening - Plan: Cytology - PAP  Screening for STD (sexually transmitted disease) - Plan: Cytology - PAP  ASCUS with positive high risk HPV cervical - Plan: Cytology - PAP; repeat pap  today.  Encounter for surveillance of implantable subdermal contraceptive--doing well. F/u prn removal.     GYN counsel adequate intake of calcium and vitamin D, diet and exercise, tobacco cessation     F/U  Return in about 1 year (around 04/05/2022).  Sharlie Shreffler B. Brileigh Sevcik, PA-C 04/05/2021 11:56 AM

## 2021-04-07 LAB — CYTOLOGY - PAP
Chlamydia: NEGATIVE
Comment: NEGATIVE
Comment: NORMAL
Diagnosis: NEGATIVE
Neisseria Gonorrhea: NEGATIVE

## 2021-05-27 ENCOUNTER — Telehealth: Payer: Medicaid Other

## 2021-05-27 NOTE — Telephone Encounter (Signed)
Pt calling; msg hard to understand; sounded like she wanted results.  CB# [phone]  # call came from [phone] Left msg that I could not understand msg; to call back.

## 2021-06-09 NOTE — Telephone Encounter (Signed)
No response in 13d.  Msg closed.

## 2021-06-12 ENCOUNTER — Ambulatory Visit
Admission: RE | Admit: 2021-06-12 | Discharge: 2021-06-12 | Disposition: A | Discharge status: Home / Self Care | Payer: 59 | Source: Ambulatory Visit | Attending: Emergency Medicine | Admitting: Emergency Medicine

## 2021-06-12 ENCOUNTER — Other Ambulatory Visit: Payer: Self-pay

## 2021-06-12 VITALS — BP 123/79 | HR 74 | Temp 99.7°F | Resp 14 | Ht 62.0 in | Wt 175.0 lb

## 2021-06-12 DIAGNOSIS — L03031 Cellulitis of right toe: Secondary | ICD-10-CM | POA: Diagnosis not present

## 2021-06-12 MED ORDER — CEPHALEXIN 500 MG PO CAPS: 500.0000 mg | ORAL_CAPSULE | Freq: Two times a day (BID) | ORAL | 0 refills | Status: AC

## 2021-06-12 NOTE — Discharge Instructions (Addendum)
Take the Keflex twice daily for 5 days.  Soak your foot in warm water and Epsom salts twice daily.  Return for new or worsening symptoms.

## 2021-06-12 NOTE — ED Triage Notes (Signed)
Patient c/o pain at her right big toe nailbed that strated yesterday.

## 2021-06-12 NOTE — ED Provider Notes (Signed)
[facility]    CSN: [id number] Arrival date & time: 06/12/21  1217      History   Chief Complaint Chief Complaint  Patient presents with   Toe Pain    Right big toe    HPI Kathy Romero is a 25 y.o. female.   HPI  25 year old female here for evaluation of right big toe pain.  Patient reports that she recently had acrylic nails put on and then yesterday she started to have pain in which she describes as a cramping sensation in her right big toe.  This morning when she woke up she noticed that there was a blister around her cuticle that was swollen and tender.  She states that she poured hydrogen peroxide on it but did not see anything draining.  She has not had any fever, drainage, or red streaks noted on her big toe or up her foot.  She states that the area is mildly tender but not extremely painful.  He is concerned that she may be allergic to her acrylic nails.  Past Medical History:  Diagnosis Date   Anxiety    ASCUS with positive high risk HPV cervical 2018   Hemorrhoids 2019   Vitamin D deficiency     Patient Active Problem List   Diagnosis Date Noted   Trichomoniasis 04/01/2020   ASCUS with positive high risk HPV cervical 01/08/2019   Breakthrough bleeding on Nexplanon 10/11/2018    Past Surgical History:  Procedure Laterality Date   NO PAST SURGERIES      OB History     Gravida  0   Para  0   Term  0   Preterm  0   AB  0   Living  0      SAB  0   IAB  0   Ectopic  0   Multiple  0   Live Births  0            Home Medications    Prior to Admission medications   Medication Sig Start Date End Date Taking? Authorizing Provider  cephALEXin (KEFLEX) 500 MG capsule Take 1 capsule (500 mg total) by mouth 2 (two) times daily for 5 days. 06/12/21 06/17/21 Yes Becky Augusta, NP  etonogestrel (IMPLANON) 68 MG IMPL implant 1 each (68 mg total) by Subdermal route once for 1 dose. 05/29/19 06/12/21 Yes Copland, Ilona Sorrel, PA-C   Vitamin D, Ergocalciferol, (DRISDOL) 1.25 MG (50000 UNIT) CAPS capsule Take by mouth. 02/08/21  Yes [provider]  Azelastine HCl 137 MCG/SPRAY SOLN Place into both nostrils. 02/22/21   [provider]  clonazePAM (KLONOPIN) 1 MG tablet 1/2 to 1 po q 12 hours prn 03/01/19 10/11/19  [provider]  fluticasone (FLONASE) 50 MCG/ACT nasal spray Place 2 sprays into both nostrils daily. 09/04/18 03/05/20  Lutricia Feil, PA-C  topiramate (TOPAMAX) 25 MG tablet Take 25 mg by mouth daily. 12/18/20 03/31/21  [provider]    Family History Family History  Problem Relation Age of Onset   Hypertension Mother    Diabetes Mother    Hypertension Father    Diabetes Father    Colon cancer Other    Diabetes Maternal Grandmother    Hypertension Maternal Grandmother    Stroke Maternal Grandmother    Breast cancer Maternal Grandmother 29   Diabetes Maternal Grandfather     Social History Social History   Tobacco Use   Smoking status: Some Days  Packs/day: 0.25    Years: 3.00    Pack years: 0.75    Types: Cigarettes   Smokeless tobacco: Never  Vaping Use   Vaping Use: Never used  Substance Use Topics   Alcohol use: Yes   Drug use: No     Allergies   Sulfamethoxazole-trimethoprim   Review of Systems Review of Systems  Constitutional:  Negative for activity change, appetite change and fever.  Musculoskeletal:  Positive for myalgias.  Skin:  Negative for color change, rash and wound.    Physical Exam Triage Vital Signs ED Triage Vitals  Enc Vitals Group     BP 06/12/21 1237 123/79     Pulse Rate 06/12/21 1237 74     Resp 06/12/21 1237 14     Temp 06/12/21 1237 99.7 F (37.6 C)     Temp Source 06/12/21 1237 Oral     SpO2 06/12/21 1237 100 %     Weight 06/12/21 1235 175 lb (79.4 kg)     Height 06/12/21 1235 5\' 2"  (1.575 m)     Head Circumference --      Peak Flow --      Pain Score 06/12/21 1235 0     Pain Loc --      Pain Edu? --       Excl. in GC? --    No data found.  Updated Vital Signs BP 123/79 (BP Location: Left Arm)   Pulse 74   Temp 99.7 F (37.6 C) (Oral)   Resp 14   Ht 5\' 2"  (1.575 m)   Wt 175 lb (79.4 kg)   SpO2 100%   BMI 32.01 kg/m   Visual Acuity Right Eye Distance:   Left Eye Distance:   Bilateral Distance:    Right Eye Near:   Left Eye Near:    Bilateral Near:     Physical Exam Vitals and nursing note reviewed.  Constitutional:      General: She is not in acute distress.    Appearance: Normal appearance. She is obese. She is not ill-appearing.  Musculoskeletal:        General: Swelling and tenderness present. No deformity. Normal range of motion.  Skin:    General: Skin is warm and dry.     Capillary Refill: Capillary refill takes less than 2 seconds.     Findings: No erythema or lesion.  Neurological:     General: No focal deficit present.     Mental Status: She is alert and oriented to person, place, and time.  Psychiatric:        Mood and Affect: Mood normal.        Behavior: Behavior normal.        Thought Content: Thought content normal.        Judgment: Judgment normal.     UC Treatments / Results  Labs (all labs ordered are listed, but only abnormal results are displayed) Labs Reviewed - No data to display  EKG   Radiology No results found.  Procedures Procedures (including critical care time)  Medications Ordered in UC Medications - No data to display  Initial Impression / Assessment and Plan / UC Course  I have reviewed the triage vital signs and the nursing notes.  Pertinent labs & imaging results that were available during my care of the patient were reviewed by me and considered in my medical decision making (see chart for details).  Patient is a very pleasant 25 year old female here for evaluation of right  great toe pain that started yesterday as outlined in HPI above.  Physical exam reveals mild fluctuance without erythema or heat to the proximal  aspect of the cuticle of her right great toenail.  There is no erythema or tenderness to the phalanx of the toe or of the IP joint.  There is no drainage evident around the nail.  Suspect patient has an early developing paronychia as result of her recent pedicure.  We will treat patient empirically with Keflex 5 mg twice daily and warm Epsom salt soaks.  Return precautions reviewed with patient.   Final Clinical Impressions(s) / UC Diagnoses   Final diagnoses:  Paronychia of great toe of right foot     Discharge Instructions      Take the Keflex twice daily for 5 days.  Soak your foot in warm water and Epsom salts twice daily.  Return for new or worsening symptoms.     ED Prescriptions     Medication Sig Dispense Auth. Provider   cephALEXin (KEFLEX) 500 MG capsule Take 1 capsule (500 mg total) by mouth 2 (two) times daily for 5 days. 10 capsule Becky Augusta, NP      PDMP not reviewed this encounter.   Becky Augusta, NP 06/12/21 [phone]

## 2021-07-08 ENCOUNTER — Ambulatory Visit
Admission: EM | Admit: 2021-07-08 | Discharge: 2021-07-08 | Disposition: A | Discharge status: Home / Self Care | Payer: 59 | Attending: Physician Assistant | Admitting: Physician Assistant

## 2021-07-08 ENCOUNTER — Other Ambulatory Visit: Payer: Self-pay

## 2021-07-08 DIAGNOSIS — B373 Candidiasis of vulva and vagina: Secondary | ICD-10-CM | POA: Insufficient documentation

## 2021-07-08 DIAGNOSIS — B3731 Acute candidiasis of vulva and vagina: Secondary | ICD-10-CM

## 2021-07-08 DIAGNOSIS — N760 Acute vaginitis: Secondary | ICD-10-CM | POA: Insufficient documentation

## 2021-07-08 DIAGNOSIS — B9689 Other specified bacterial agents as the cause of diseases classified elsewhere: Secondary | ICD-10-CM | POA: Insufficient documentation

## 2021-07-08 LAB — CHLAMYDIA/NGC RT PCR (ARMC ONLY)
Chlamydia Tr: NOT DETECTED
N gonorrhoeae: NOT DETECTED

## 2021-07-08 LAB — WET PREP, GENITAL
Sperm: NONE SEEN
Trich, Wet Prep: NONE SEEN

## 2021-07-08 MED ORDER — FLUCONAZOLE 150 MG PO TABS: 150.0000 mg | ORAL_TABLET | ORAL | 0 refills | Status: AC | PRN

## 2021-07-08 MED ORDER — FLUCONAZOLE 150 MG PO TABS: 150.0000 mg | ORAL_TABLET | ORAL | 0 refills | Status: DC | PRN

## 2021-07-08 MED ORDER — MICONAZOLE NITRATE 2 % EX CREA: 1.0000 "application " | TOPICAL_CREAM | Freq: Two times a day (BID) | CUTANEOUS | 0 refills | Status: DC

## 2021-07-08 MED ORDER — METRONIDAZOLE 500 MG PO TABS: 500.0000 mg | ORAL_TABLET | Freq: Two times a day (BID) | ORAL | 0 refills | Status: AC

## 2021-07-08 MED ORDER — METRONIDAZOLE 500 MG PO TABS: 500.0000 mg | ORAL_TABLET | Freq: Two times a day (BID) | ORAL | 0 refills | Status: DC

## 2021-07-08 NOTE — ED Triage Notes (Signed)
Pt states she has been having discharge and itching for 3 days. No new partners. Uses protection.

## 2021-07-08 NOTE — ED Provider Notes (Signed)
[facility]    CSN: [id number] Arrival date & time: 07/08/21  1328      History   Chief Complaint Chief Complaint  Patient presents with   Vaginal Itching   Vaginal Discharge    HPI Kathy Romero is a 25 y.o. female presenting for approximately 3-day history of significant vaginal discharge and itching.  She denies any dysuria, urinary frequency or urgency.  Has not noted any blood in the urine.  Denies any pelvic or abdominal pain or back pain.  Patient believes she may have a yeast infection.  She states that she ordered boric acid tablets but cannot wait for them to come in because her symptoms are not improving.  Patient says she started over-the-counter intravaginal Monistat in the past but it seemed to exacerbate her symptoms.  Patient has the same partner and denies any concern for STIs.  No other complaints.  HPI  Past Medical History:  Diagnosis Date   Anxiety    ASCUS with positive high risk HPV cervical 2018   Hemorrhoids 2019   Vitamin D deficiency     Patient Active Problem List   Diagnosis Date Noted   Trichomoniasis 04/01/2020   ASCUS with positive high risk HPV cervical 01/08/2019   Breakthrough bleeding on Nexplanon 10/11/2018    Past Surgical History:  Procedure Laterality Date   NO PAST SURGERIES      OB History     Gravida  0   Para  0   Term  0   Preterm  0   AB  0   Living  0      SAB  0   IAB  0   Ectopic  0   Multiple  0   Live Births  0            Home Medications    Prior to Admission medications   Medication Sig Start Date End Date Taking? Authorizing Provider  Azelastine HCl 137 MCG/SPRAY SOLN Place into both nostrils. 02/22/21  Yes [provider]  etonogestrel (IMPLANON) 68 MG IMPL implant 1 each (68 mg total) by Subdermal route once for 1 dose. 05/29/19 07/08/21 Yes Copland, Ilona Sorrel, PA-C  Vitamin D, Ergocalciferol, (DRISDOL) 1.25 MG (50000 UNIT) CAPS capsule Take by mouth. 02/08/21   Yes [provider]  fluconazole (DIFLUCAN) 150 MG tablet Take 1 tablet (150 mg total) by mouth every 3 (three) days as needed for up to 7 days (yeast infection). 07/08/21 07/15/21  Shirlee Latch, PA-C  metroNIDAZOLE (FLAGYL) 500 MG tablet Take 1 tablet (500 mg total) by mouth 2 (two) times daily for 7 days. 07/08/21 07/15/21  Eusebio Friendly B, PA-C  miconazole (MICOTIN) 2 % cream Apply 1 application topically 2 (two) times daily. 07/08/21   Shirlee Latch, PA-C  clonazePAM (KLONOPIN) 1 MG tablet 1/2 to 1 po q 12 hours prn 03/01/19 10/11/19  [provider]  fluticasone (FLONASE) 50 MCG/ACT nasal spray Place 2 sprays into both nostrils daily. 09/04/18 03/05/20  Lutricia Feil, PA-C  topiramate (TOPAMAX) 25 MG tablet Take 25 mg by mouth daily. 12/18/20 03/31/21  [provider]    Family History Family History  Problem Relation Age of Onset   Hypertension Mother    Diabetes Mother    Hypertension Father    Diabetes Father    Colon cancer Other    Diabetes Maternal Grandmother    Hypertension Maternal Grandmother    Stroke Maternal Grandmother  Breast cancer Maternal Grandmother 26   Diabetes Maternal Grandfather     Social History Social History   Tobacco Use   Smoking status: Some Days    Packs/day: 0.25    Years: 3.00    Pack years: 0.75    Types: Cigarettes   Smokeless tobacco: Never  Vaping Use   Vaping Use: Never used  Substance Use Topics   Alcohol use: Yes   Drug use: No     Allergies   Sulfamethoxazole-trimethoprim   Review of Systems Review of Systems  Constitutional:  Negative for fatigue and fever.  Gastrointestinal:  Negative for abdominal pain.  Genitourinary:  Positive for vaginal discharge. Negative for dysuria, flank pain, frequency, hematuria, urgency and vaginal pain.  Musculoskeletal:  Negative for back pain.  Skin:  Negative for rash.    Physical Exam Triage Vital Signs ED Triage Vitals  Enc Vitals Group     BP  07/08/21 1350 134/90     Pulse Rate 07/08/21 1350 (!) 55     Resp 07/08/21 1350 16     Temp 07/08/21 1350 98.3 F (36.8 C)     Temp Source 07/08/21 1350 Oral     SpO2 07/08/21 1350 100 %     Weight 07/08/21 1347 185 lb (83.9 kg)     Height 07/08/21 1347 5\' 2"  (1.575 m)     Head Circumference --      Peak Flow --      Pain Score 07/08/21 1347 0     Pain Loc --      Pain Edu? --      Excl. in GC? --    No data found.  Updated Vital Signs BP 134/90 (BP Location: Right Arm)   Pulse (!) 55   Temp 98.3 F (36.8 C) (Oral)   Resp 16   Ht 5\' 2"  (1.575 m)   Wt 185 lb (83.9 kg)   SpO2 100%   BMI 33.84 kg/m       Physical Exam Vitals and nursing note reviewed.  Constitutional:      General: She is not in acute distress.    Appearance: Normal appearance. She is not ill-appearing or toxic-appearing.  HENT:     Head: Normocephalic and atraumatic.  Eyes:     General: No scleral icterus.       Right eye: No discharge.        Left eye: No discharge.     Conjunctiva/sclera: Conjunctivae normal.  Cardiovascular:     Rate and Rhythm: Regular rhythm. Bradycardia present.     Heart sounds: Normal heart sounds.  Pulmonary:     Effort: Pulmonary effort is normal. No respiratory distress.     Breath sounds: Normal breath sounds.  Abdominal:     Palpations: Abdomen is soft.     Tenderness: There is no abdominal tenderness. There is no right CVA tenderness or left CVA tenderness.  Musculoskeletal:     Cervical back: Neck supple.  Skin:    General: Skin is dry.  Neurological:     General: No focal deficit present.     Mental Status: She is alert. Mental status is at baseline.     Motor: No weakness.     Gait: Gait normal.  Psychiatric:        Mood and Affect: Mood normal.        Behavior: Behavior normal.        Thought Content: Thought content normal.     UC Treatments /  Results  Labs (all labs ordered are listed, but only abnormal results are displayed) Labs Reviewed   WET PREP, GENITAL - Abnormal; Notable for the following components:      Result Value   Yeast Wet Prep HPF POC PRESENT (*)    Clue Cells Wet Prep HPF POC PRESENT (*)    WBC, Wet Prep HPF POC TOO NUMEROUS TO COUNT (*)    All other components within normal limits  CHLAMYDIA/NGC RT PCR [facility] ONLY)              EKG   Radiology No results found.  Procedures Procedures (including critical care time)  Medications Ordered in UC Medications - No data to display  Initial Impression / Assessment and Plan / UC Course  I have reviewed the triage vital signs and the nursing notes.  Pertinent labs & imaging results that were available during my care of the patient were reviewed by me and considered in my medical decision making (see chart for details).  25 year old female presenting for vaginal discharge and itching x3 days.  Patient elected to forego a GU exam and swab herself.  Wet prep ordered.  Wet prep is positive for clue cells and yeast.  Reviewed results with patient and advised her she has BV and yeast infection.  I have sent metronidazole, Diflucan and miconazole topical to the pharmacy.  Advised patient to follow-up as needed.   Final Clinical Impressions(s) / UC Diagnoses   Final diagnoses:  Yeast vaginitis  BV (bacterial vaginosis)     Discharge Instructions      You have a bacterial vaginosis and yeast infection.  I have sent antibiotics for the BV infection which is not sexually transmitted and Diflucan as well as topical antifungal for the yeast infection.  I have sent a couple doses of the Diflucan since she will be on an antibiotic and that can keep the yeast infection there for a little longer.     ED Prescriptions     Medication Sig Dispense Auth. Provider   fluconazole (DIFLUCAN) 150 MG tablet  (Status: Discontinued) Take 1 tablet (150 mg total) by mouth every 3 (three) days as needed for up to 7 days (yeast infection). 3 tablet Eusebio Friendly B, PA-C    metroNIDAZOLE (FLAGYL) 500 MG tablet  (Status: Discontinued) Take 1 tablet (500 mg total) by mouth 2 (two) times daily for 7 days. 14 tablet Eusebio Friendly B, PA-C   miconazole (MICOTIN) 2 % cream  (Status: Discontinued) Apply 1 application topically 2 (two) times daily. 28.35 g Eusebio Friendly B, PA-C   fluconazole (DIFLUCAN) 150 MG tablet Take 1 tablet (150 mg total) by mouth every 3 (three) days as needed for up to 7 days (yeast infection). 3 tablet Eusebio Friendly B, PA-C   metroNIDAZOLE (FLAGYL) 500 MG tablet Take 1 tablet (500 mg total) by mouth 2 (two) times daily for 7 days. 14 tablet Eusebio Friendly B, PA-C   miconazole (MICOTIN) 2 % cream Apply 1 application topically 2 (two) times daily. 28.35 g Shirlee Latch, PA-C      PDMP not reviewed this encounter.   Shirlee Latch, PA-C 07/08/21 1434

## 2021-07-08 NOTE — Discharge Instructions (Addendum)
You have a bacterial vaginosis and yeast infection.  I have sent antibiotics for the BV infection which is not sexually transmitted and Diflucan as well as topical antifungal for the yeast infection.  I have sent a couple doses of the Diflucan since she will be on an antibiotic and that can keep the yeast infection there for a little longer.

## 2021-10-11 IMAGING — US US ABDOMEN LIMITED
1 series · 14 of 25 positions shown · non-contrast
Comparison: None.

CLINICAL DATA: Acute onset epigastric pain, nausea, vomiting and
diarrhea for 4 hours

EXAM:
ULTRASOUND ABDOMEN LIMITED RIGHT UPPER QUADRANT

[Series 1: us abdomen limited ruq (liver/gb) · 14 of 40 slices shown]
[im 1/40]
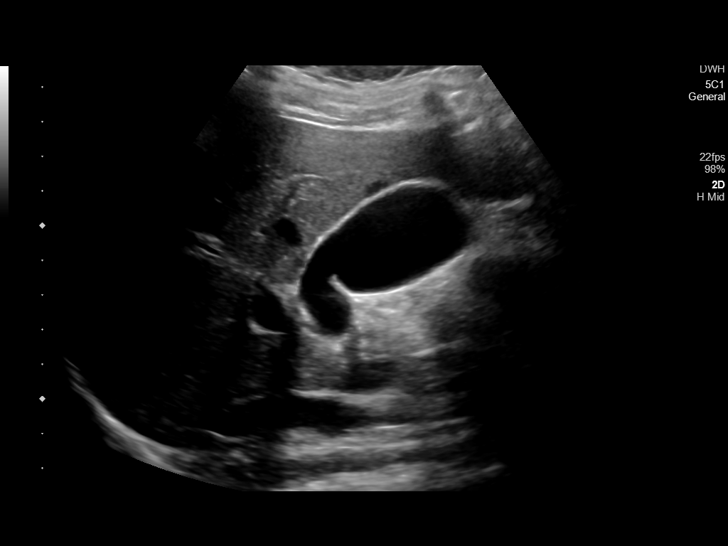
[im 4/40]
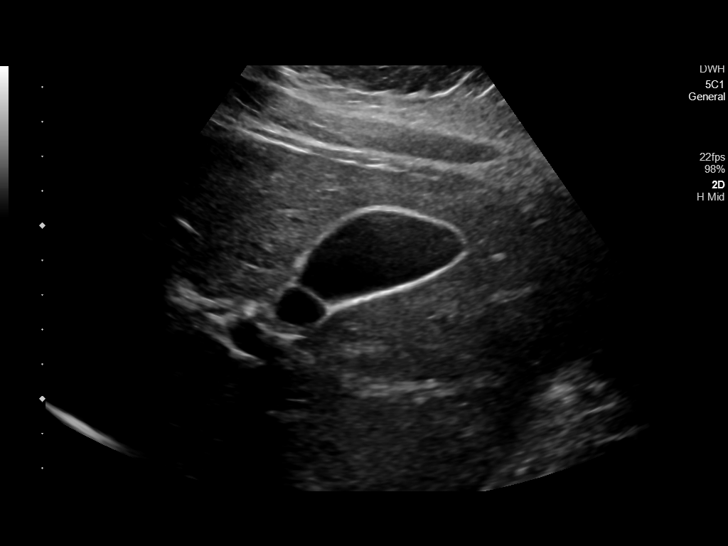
[im 7/40]
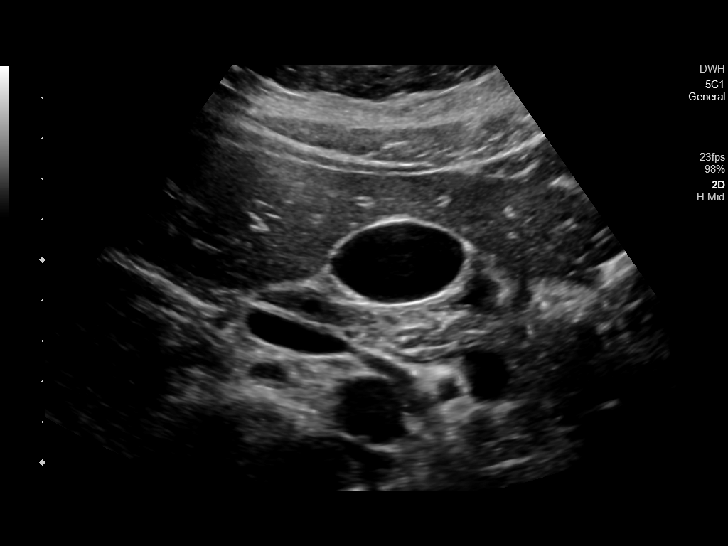
[im 10/40]
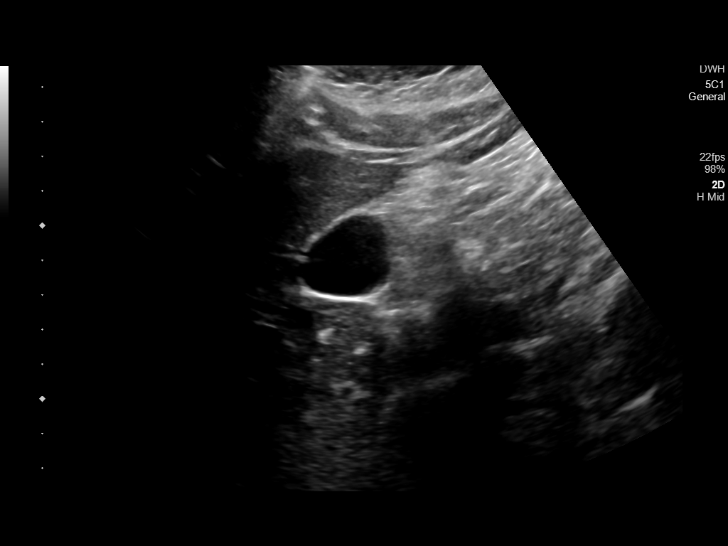
[im 14/40]
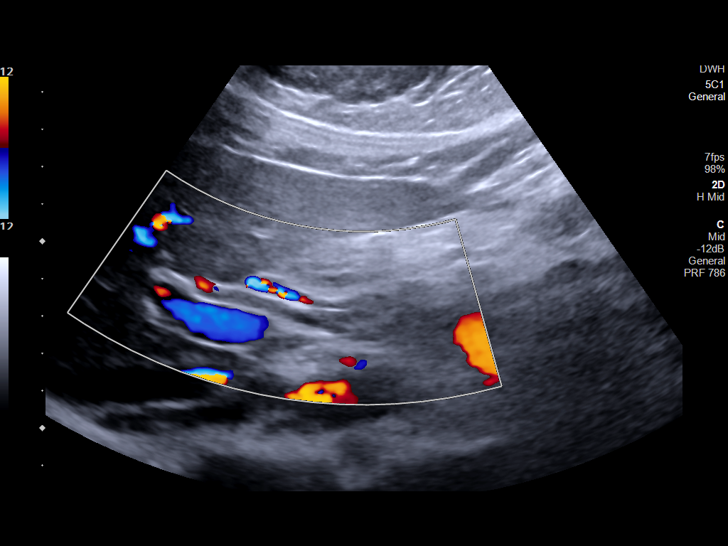
[im 15/40]
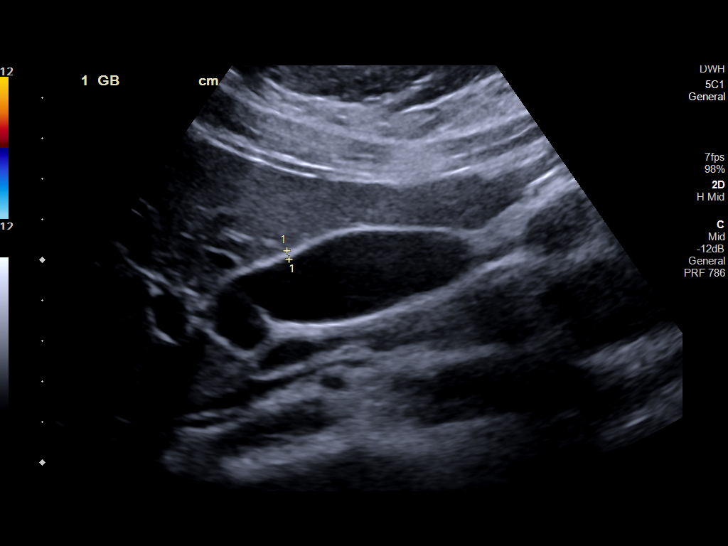
[im 18/40]
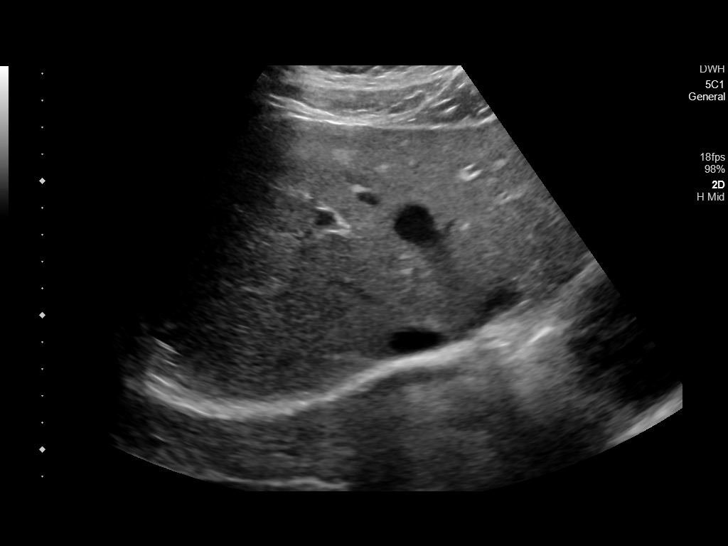
[im 22/40]
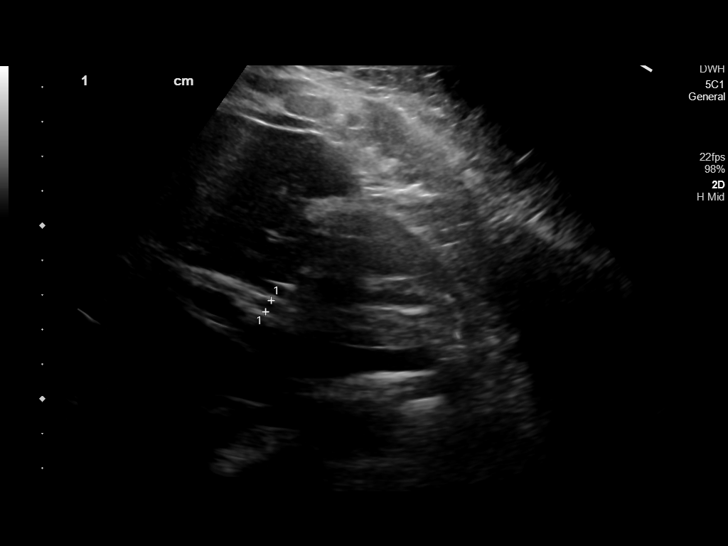
[im 25/40]
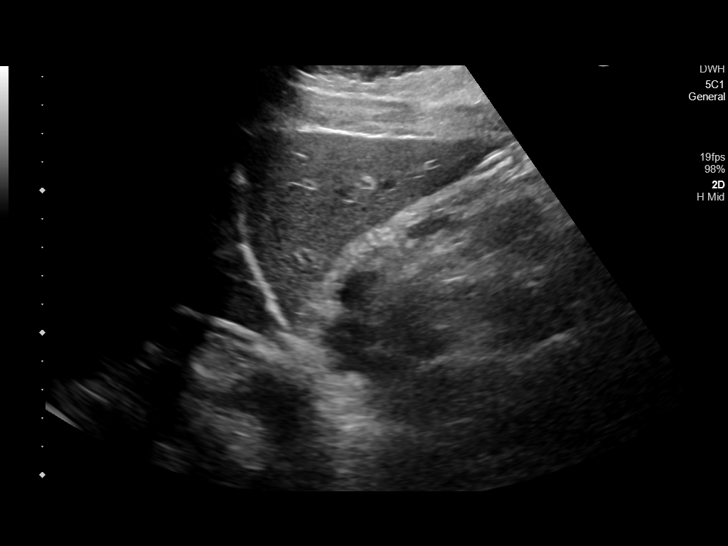
[im 27/40]
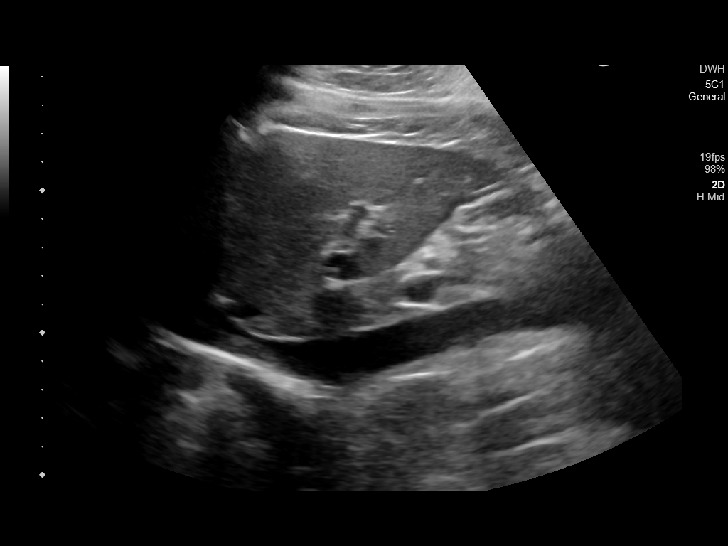
[im 30/40]
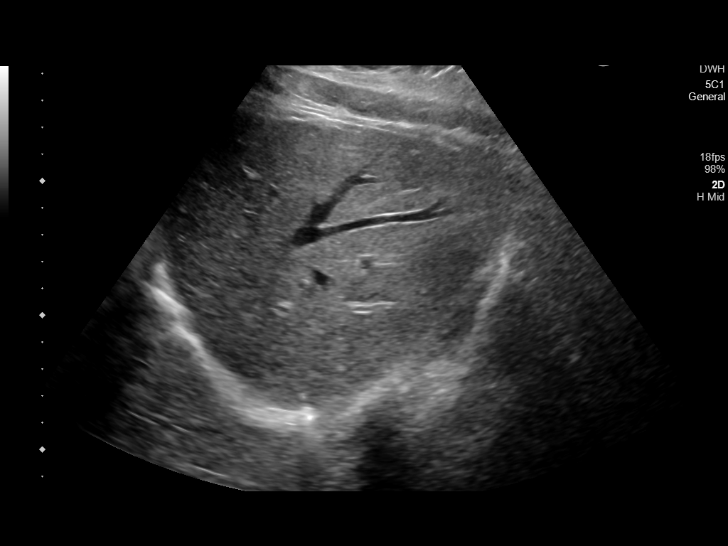
[im 33/40]
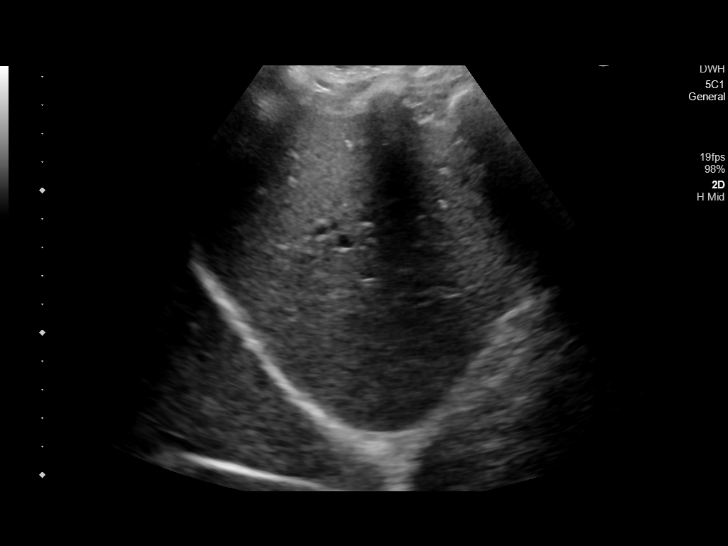
[im 36/40]
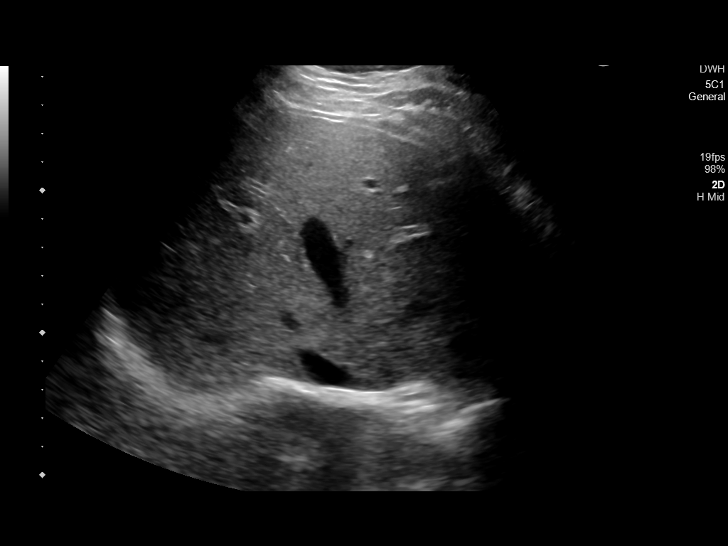
[im 40/40]
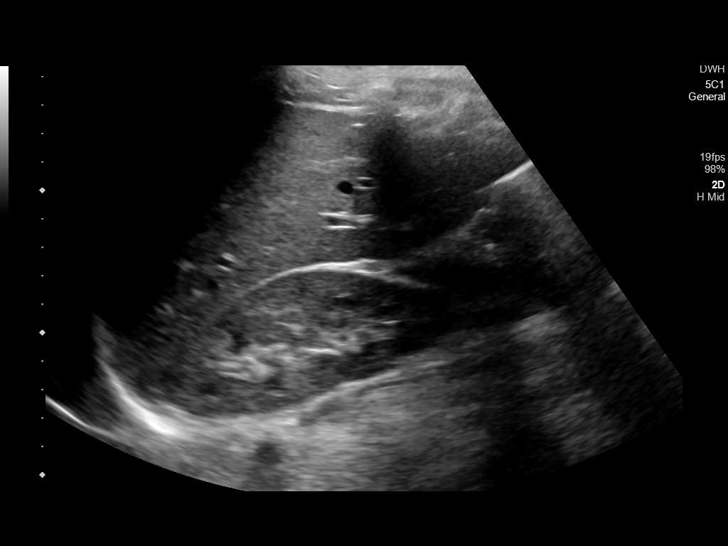

[14 of 25 positions shown; findings below may reference images not displayed]

FINDINGS: Gallbladder:

No gallstones or wall thickening visualized. No sonographic Murphy
sign noted by sonographer.

Common bile duct:

Diameter: 3.7 mm, nondilated

Liver:

No focal liver lesion. Normal hepatic echogenicity and echotexture.
No intrahepatic biliary ductal dilatation. Portal vein is patent on
color Doppler imaging with normal direction of blood flow towards
the liver.

Other: None.
IMPRESSION: Unremarkable right upper quadrant ultrasound.

## 2021-12-02 ENCOUNTER — Ambulatory Visit: Payer: Medicaid Other | Admitting: Obstetrics and Gynecology

## 2021-12-03 NOTE — Progress Notes (Deleted)
° ° °  Kathy Romero, Kathy Romero   No chief complaint on file.   HPI:      Kathy Romero is a 26 y.o. G0P0000 whose LMP was No LMP recorded. Patient has had an implant., presents today for ***  Neg STD testing 8/22; hx of trich  Patient Active Problem List   Diagnosis Date Noted   Trichomoniasis 04/01/2020   ASCUS with positive high risk HPV cervical 01/08/2019   Breakthrough bleeding on Nexplanon 10/11/2018    Past Surgical History:  Procedure Laterality Date   NO PAST SURGERIES      Family History  Problem Relation Age of Onset   Hypertension Mother    Diabetes Mother    Hypertension Father    Diabetes Father    Colon cancer Other    Diabetes Maternal Grandmother    Hypertension Maternal Grandmother    Stroke Maternal Grandmother    Breast cancer Maternal Grandmother 93   Diabetes Maternal Grandfather     Social History   Socioeconomic History   Marital status: Single    Spouse name: Not on file   Number of children: Not on file   Years of education: Not on file   Highest education level: Not on file  Occupational History   Not on file  Tobacco Use   Smoking status: Some Days    Packs/day: 0.25    Years: 3.00    Pack years: 0.75    Types: Cigarettes   Smokeless tobacco: Never  Vaping Use   Vaping Use: Never used  Substance and Sexual Activity   Alcohol use: Yes   Drug use: No   Sexual activity: Yes    Birth control/protection: Implant  Other Topics Concern   Not on file  Social History Narrative   Not on file   Social Determinants of Health   Financial Resource Strain: Not on file  Food Insecurity: Not on file  Transportation Needs: Not on file  Physical Activity: Not on file  Stress: Not on file  Social Connections: Not on file  Intimate Partner Violence: Not on file    Outpatient Medications Prior to Visit  Medication Sig Dispense Refill   Azelastine HCl 137 MCG/SPRAY SOLN Place into both nostrils.     etonogestrel  (IMPLANON) 68 MG IMPL implant 1 each (68 mg total) by Subdermal route once for 1 dose. 1 each 0   miconazole (MICOTIN) 2 % cream Apply 1 application topically 2 (two) times daily. 28.35 g 0   Vitamin D, Ergocalciferol, (DRISDOL) 1.25 MG (50000 UNIT) CAPS capsule Take by mouth.     No facility-administered medications prior to visit.      ROS:  Review of Systems BREAST: No symptoms   OBJECTIVE:   Vitals:  There were no vitals taken for this visit.  Physical Exam  Results: No results found for this or any previous visit (from the past 24 hour(s)).   Assessment/Plan: No diagnosis found.    No orders of the defined types were placed in this encounter.     No follow-ups on file.  Kathy Traore B. Daneille Desilva, PA-C 12/03/2021 9:59 AM

## 2021-12-06 ENCOUNTER — Ambulatory Visit: Payer: Medicaid Other | Admitting: Obstetrics and Gynecology

## 2022-01-12 ENCOUNTER — Ambulatory Visit: Payer: 59 | Admitting: Obstetrics and Gynecology

## 2022-01-12 ENCOUNTER — Telehealth: Payer: Self-pay

## 2022-01-12 NOTE — Telephone Encounter (Signed)
Pt had appt this afternoon for pap smear/testing. Per ABC I called pt to ask if she was having issues that she needed to be seen? If no issues, her annual/pap is due 03/2022. I advised pt her annual (for insurance purposes) is due May 2023. She said she just wanted a checkup. I asked if she meant std testing? She said yes. I advised she can keep today's appt for std testing. She then responded with "cancel my appt today, I can go to health dept to get std tested if Im not having my annual". FD aware to cancel today's appt.

## 2022-05-11 ENCOUNTER — Telehealth: Payer: Self-pay | Admitting: Obstetrics

## 2022-05-11 NOTE — Telephone Encounter (Signed)
Pt is scheduled with Dr. Okey Dupre for Nexplanon removal and replacement on 6/26.

## 2022-05-11 NOTE — Telephone Encounter (Signed)
Noted. Will order to arrive by appointment date/time. 

## 2022-05-12 ENCOUNTER — Ambulatory Visit: Payer: 59 | Admitting: Obstetrics and Gynecology

## 2022-05-16 ENCOUNTER — Telehealth: Payer: Self-pay | Admitting: Obstetrics

## 2022-05-16 NOTE — Telephone Encounter (Signed)
Patient is scheduled to see Dr Okey Dupre on 05/23/22. Let message to see if patient would like to be seen on 05/16/22

## 2022-05-23 ENCOUNTER — Encounter: Payer: Self-pay | Admitting: Obstetrics

## 2022-05-23 ENCOUNTER — Encounter: Payer: 59 | Admitting: Obstetrics

## 2022-06-02 ENCOUNTER — Ambulatory Visit: Payer: 59

## 2022-06-02 ENCOUNTER — Ambulatory Visit: Payer: Self-pay

## 2023-01-02 ENCOUNTER — Ambulatory Visit
Admission: EM | Admit: 2023-01-02 | Discharge: 2023-01-02 | Disposition: A | Discharge status: Home / Self Care | Payer: 59 | Attending: Family Medicine | Admitting: Family Medicine

## 2023-01-02 ENCOUNTER — Encounter: Payer: Self-pay | Admitting: Emergency Medicine

## 2023-01-02 DIAGNOSIS — K0889 Other specified disorders of teeth and supporting structures: Secondary | ICD-10-CM

## 2023-01-02 MED ORDER — FLUCONAZOLE 150 MG PO TABS: 150.0000 mg | ORAL_TABLET | ORAL | 0 refills | Status: AC

## 2023-01-02 MED ORDER — AMOXICILLIN 875 MG PO TABS: 875.0000 mg | ORAL_TABLET | Freq: Two times a day (BID) | ORAL | 0 refills | Status: AC

## 2023-01-02 MED ORDER — HYDROCODONE-ACETAMINOPHEN 5-325 MG PO TABS: 1.0000 | ORAL_TABLET | ORAL | 0 refills | Status: DC | PRN

## 2023-01-02 NOTE — ED Triage Notes (Signed)
Pt states 3 days ago she had 3 wisdom teeth pulled and she has been in pain and has some swelling. She tried to get back in with the dentist, but they told her to come into urgent care.

## 2023-01-02 NOTE — ED Provider Notes (Signed)
[facility]    CSN: [id number] Arrival date & time: 01/02/23  0836      History   Chief Complaint Chief Complaint  Patient presents with   Dental Pain    HPI Kathy Romero is a 27 y.o. female.   HPI  Kathy Romero presents for dental pain. She got her top wisdom teeth and one on the bottom right after getting teeth pulled on Friday.  She has been taking Tylenol and Motrin.  She has been applying ice and eating popsicles that aren't helping.  She contacted her dentist this morning and they didn't have any appointments and she was told to come here.  She has been keeping her mouth wrapped with ice pack and not smoking. She hasn't smoked since the event.     Past Medical History:  Diagnosis Date   Anxiety    ASCUS with positive high risk HPV cervical 2018   Hemorrhoids 2019   Vitamin D deficiency     Patient Active Problem List   Diagnosis Date Noted   Trichomoniasis 04/01/2020   ASCUS with positive high risk HPV cervical 01/08/2019   Breakthrough bleeding on Nexplanon 10/11/2018    Past Surgical History:  Procedure Laterality Date   NO PAST SURGERIES      OB History     Gravida  0   Para  0   Term  0   Preterm  0   AB  0   Living  0      SAB  0   IAB  0   Ectopic  0   Multiple  0   Live Births  0            Home Medications    Prior to Admission medications   Medication Sig Start Date End Date Taking? Authorizing Provider  amoxicillin (AMOXIL) 875 MG tablet Take 1 tablet (875 mg total) by mouth 2 (two) times daily for 7 days. 01/02/23 01/09/23 Yes Pearce Littlefield, DO  fluconazole (DIFLUCAN) 150 MG tablet Take 1 tablet (150 mg total) by mouth every 3 (three) days for 2 doses. 01/02/23 01/06/23 Yes Forestine Macho, Ronnette Juniper, DO  HYDROcodone-acetaminophen (NORCO/VICODIN) 5-325 MG tablet Take 1-2 tablets by mouth every 4 (four) hours as needed. 01/02/23  Yes Ioana Louks, DO  etonogestrel (IMPLANON) 68 MG IMPL implant 1 each (68 mg total) by  Subdermal route once for 1 dose. 05/29/19 9/38/10  Copland, Deirdre Evener, PA-C  traZODone (DESYREL) 50 MG tablet Take by mouth. 08/23/21 08/23/22  [provider]  Vitamin D, Ergocalciferol, (DRISDOL) 1.25 MG (50000 UNIT) CAPS capsule Take by mouth. 02/08/21   [provider]  clonazePAM (KLONOPIN) 1 MG tablet 1/2 to 1 po q 12 hours prn 03/01/19 10/11/19  [provider]  fluticasone (FLONASE) 50 MCG/ACT nasal spray Place 2 sprays into both nostrils daily. 09/04/18 03/05/20  Lorin Picket, PA-C  topiramate (TOPAMAX) 25 MG tablet Take 25 mg by mouth daily. 12/18/20 03/31/21  [provider]    Family History Family History  Problem Relation Age of Onset   Hypertension Mother    Diabetes Mother    Hypertension Father    Diabetes Father    Colon cancer Other    Diabetes Maternal Grandmother    Hypertension Maternal Grandmother    Stroke Maternal Grandmother    Breast cancer Maternal Grandmother 80   Diabetes Maternal Grandfather     Social History Social History   Tobacco Use   Smoking status: Some Days  Packs/day: 0.25    Years: 3.00    Total pack years: 0.75    Types: Cigarettes   Smokeless tobacco: Never  Vaping Use   Vaping Use: Never used  Substance Use Topics   Alcohol use: Yes   Drug use: No     Allergies   Sulfamethoxazole-trimethoprim   Review of Systems Review of Systems : negative unless otherwise stated in HPI.      Physical Exam Triage Vital Signs ED Triage Vitals  Enc Vitals Group     BP 01/02/23 0847 (!) 140/84     Pulse Rate 01/02/23 0847 86     Resp 01/02/23 0847 18     Temp 01/02/23 0847 99.1 F (37.3 C)     Temp Source 01/02/23 0847 Oral     SpO2 01/02/23 0847 100 %     Weight --      Height --      Head Circumference --      Peak Flow --      Pain Score 01/02/23 0846 9     Pain Loc --      Pain Edu? --      Excl. in [place]? --    No data found.  Updated Vital Signs BP (!) 140/84 (BP Location: Left Arm)    Pulse 86   Temp 99.1 F (37.3 C) (Oral)   Resp 18   SpO2 100%   Visual Acuity Right Eye Distance:   Left Eye Distance:   Bilateral Distance:    Right Eye Near:   Left Eye Near:    Bilateral Near:     Physical Exam  GEN:     alert, uncomfortable appearing female in no distress    HENT:  mucus membranes moist, oropharyngeal without lesions exudates or erythema, nasal discharge, posterior upper and lower right dental extraction sites are tender to percussion, left upper site is non-tender,  no trismus, no secretion pooling, no palpable induration and no visible swelling of the floor the mouth, cheek near molars with edema, normal jaw movement without difficulty EYES:   pupils equal and reactive, no scleral injection or discharge NECK:  normal ROM, no lymphadenopathy, no meningismus   RESP:  no increased work of breathing CVS:   regular rate  Skin:   warm and dry    UC Treatments / Results  Labs (all labs ordered are listed, but only abnormal results are displayed) Labs Reviewed - No data to display  EKG   Radiology No results found.  Procedures Procedures (including critical care time)  Medications Ordered in UC Medications - No data to display  Initial Impression / Assessment and Plan / UC Course  I have reviewed the triage vital signs and the nursing notes.  Pertinent labs & imaging results that were available during my care of the patient were reviewed by me and considered in my medical decision making (see chart for details).     Pt is a 27 y.o. female who presents for acute dental pain after dental extraction 3 days ago. Kathy Romero has an elevated temperature here of 99.1 F.  She is hypertensive.  Satting well on room air. Overall pt is uncomfortable appearing, well hydrated, without respiratory distress.  Dental exam and elevated temperature concerning for possible dental abscess. Treat with antibiotics and pain control as below. She is to follow up with her dentist  if symptoms worsen.   - continue Tylenol with Motrin as needed for discomfort - Norco prescribed for severe pain -  Gargle with salt water several times a day - Discussed  ED precautions, understanding voiced.   Discussed MDM, treatment plan and plan for follow-up with patient who agrees with plan.   Final Clinical Impressions(s) / UC Diagnoses   Final diagnoses:  Pain, dental     Discharge Instructions      Stop by the pharmacy to pick up your prescriptions.  Follow up with your primary care provider as needed.      ED Prescriptions     Medication Sig Dispense Auth. Provider   amoxicillin (AMOXIL) 875 MG tablet Take 1 tablet (875 mg total) by mouth 2 (two) times daily for 7 days. 14 tablet Verne Lanuza, DO   fluconazole (DIFLUCAN) 150 MG tablet Take 1 tablet (150 mg total) by mouth every 3 (three) days for 2 doses. 2 tablet Vineta Carone, DO   HYDROcodone-acetaminophen (NORCO/VICODIN) 5-325 MG tablet Take 1-2 tablets by mouth every 4 (four) hours as needed. 10 tablet Lyndee Hensen, DO      I have reviewed the PDMP during this encounter.   Lyndee Hensen, DO 01/02/23 [phone]

## 2023-01-02 NOTE — Discharge Instructions (Addendum)
Stop by the pharmacy to pick up your prescriptions.  Follow up with your primary care provider as needed.  

## 2023-09-14 ENCOUNTER — Ambulatory Visit
Admission: EM | Admit: 2023-09-14 | Discharge: 2023-09-14 | Disposition: A | Discharge status: Home / Self Care | Payer: 59 | Attending: Physician Assistant | Admitting: Physician Assistant

## 2023-09-14 DIAGNOSIS — R051 Acute cough: Secondary | ICD-10-CM

## 2023-09-14 DIAGNOSIS — Z1152 Encounter for screening for COVID-19: Secondary | ICD-10-CM | POA: Diagnosis not present

## 2023-09-14 DIAGNOSIS — J069 Acute upper respiratory infection, unspecified: Secondary | ICD-10-CM

## 2023-09-14 DIAGNOSIS — B9789 Other viral agents as the cause of diseases classified elsewhere: Secondary | ICD-10-CM | POA: Insufficient documentation

## 2023-09-14 DIAGNOSIS — R0981 Nasal congestion: Secondary | ICD-10-CM | POA: Diagnosis present

## 2023-09-14 DIAGNOSIS — J029 Acute pharyngitis, unspecified: Secondary | ICD-10-CM | POA: Diagnosis not present

## 2023-09-14 LAB — SARS CORONAVIRUS 2 BY RT PCR: SARS Coronavirus 2 by RT PCR: NEGATIVE

## 2023-09-14 LAB — GROUP A STREP BY PCR: Group A Strep by PCR: NOT DETECTED

## 2023-09-14 MED ORDER — IPRATROPIUM BROMIDE 0.06 % NA SOLN: 2.0000 | Freq: Four times a day (QID) | NASAL | 0 refills | Status: DC

## 2023-09-14 MED ORDER — PROMETHAZINE-DM 6.25-15 MG/5ML PO SYRP: 5.0000 mL | ORAL_SOLUTION | Freq: Four times a day (QID) | ORAL | 0 refills | Status: DC | PRN

## 2023-09-14 NOTE — ED Triage Notes (Signed)
Patient here today with c/o productive cough, nasal congestion, ST, and weakness X 2 days. She tried taking Nyquil and Alka Seltzer cold and flu with a little relief. No sick contacts. No recent travel. Had Flu shot on Saturday.

## 2023-09-14 NOTE — ED Provider Notes (Signed)
[facility]    CSN: [id number] Arrival date & time: 09/14/23  0801      History   Chief Complaint Chief Complaint  Patient presents with   Nasal Congestion    HPI Kathy Romero is a 27 y.o. female presenting for 2-day history of fatigue, cough productive of sputum, nasal congestion, sore throat, and headaches.  Denies fever, body aches, chest pain, shortness of breath, abdominal pain, nausea/vomiting or diarrhea.  Denies any sick contacts or recent travel.  She received a flu shot last weekend.  Taking OTC Alka-Seltzer cold and flu medication with minimal improvement in symptoms.  No medical history significant for cardiopulmonary disease.  No other complaints.  HPI  Past Medical History:  Diagnosis Date   Anxiety    ASCUS with positive high risk HPV cervical 2018   Hemorrhoids 2019   Vitamin D deficiency     Patient Active Problem List   Diagnosis Date Noted   Trichomoniasis 04/01/2020   ASCUS with positive high risk HPV cervical 01/08/2019   Breakthrough bleeding on Nexplanon 10/11/2018    Past Surgical History:  Procedure Laterality Date   NO PAST SURGERIES      OB History     Gravida  0   Para  0   Term  0   Preterm  0   AB  0   Living  0      SAB  0   IAB  0   Ectopic  0   Multiple  0   Live Births  0            Home Medications    Prior to Admission medications   Medication Sig Start Date End Date Taking? Authorizing Provider  citalopram (CELEXA) 20 MG tablet Take 20 mg by mouth daily. 08/31/23 08/30/24 Yes [provider]  fluticasone (VERAMYST) 27.5 MCG/SPRAY nasal spray Place 1 spray into the nose. 01/23/23 01/23/24 Yes [provider]  ipratropium (ATROVENT) 0.06 % nasal spray Place 2 sprays into both nostrils 4 (four) times daily. 09/14/23  Yes Shirlee Latch, PA-C  promethazine (PHENERGAN) 25 MG tablet Take 25 mg by mouth every 6 (six) hours as needed. 08/31/23  Yes [provider]   promethazine-dextromethorphan (PROMETHAZINE-DM) 6.25-15 MG/5ML syrup Take 5 mLs by mouth 4 (four) times daily as needed. 09/14/23  Yes Shirlee Latch, PA-C  etonogestrel (IMPLANON) 68 MG IMPL implant 1 each (68 mg total) by Subdermal route once for 1 dose. 05/29/19 07/08/21  Copland, Ilona Sorrel, PA-C  traZODone (DESYREL) 50 MG tablet Take by mouth. 08/23/21 08/23/22  [provider]  Vitamin D, Ergocalciferol, (DRISDOL) 1.25 MG (50000 UNIT) CAPS capsule Take by mouth. 02/08/21   [provider]  clonazePAM (KLONOPIN) 1 MG tablet 1/2 to 1 po q 12 hours prn 03/01/19 10/11/19  [provider]  fluticasone (FLONASE) 50 MCG/ACT nasal spray Place 2 sprays into both nostrils daily. 09/04/18 03/05/20  Lutricia Feil, PA-C  topiramate (TOPAMAX) 25 MG tablet Take 25 mg by mouth daily. 12/18/20 03/31/21  [provider]    Family History Family History  Problem Relation Age of Onset   Hypertension Mother    Diabetes Mother    Hypertension Father    Diabetes Father    Colon cancer Other    Diabetes Maternal Grandmother    Hypertension Maternal Grandmother    Stroke Maternal Grandmother    Breast cancer Maternal Grandmother 64   Diabetes Maternal Grandfather  Social History Social History   Tobacco Use   Smoking status: Former    Current packs/day: 0.25    Average packs/day: 0.3 packs/day for 3.0 years (0.8 ttl pk-yrs)    Types: Cigarettes   Smokeless tobacco: Never  Vaping Use   Vaping status: Some Days  Substance Use Topics   Alcohol use: Yes   Drug use: No     Allergies   Sulfamethoxazole-trimethoprim   Review of Systems Review of Systems  Constitutional:  Positive for fatigue. Negative for chills, diaphoresis and fever.  HENT:  Positive for congestion, rhinorrhea and sore throat. Negative for ear pain, sinus pressure and sinus pain.   Respiratory:  Positive for cough. Negative for shortness of breath.   Cardiovascular:  Negative for chest pain.   Gastrointestinal:  Negative for abdominal pain, nausea and vomiting.  Musculoskeletal:  Negative for arthralgias and myalgias.  Skin:  Negative for rash.  Neurological:  Positive for headaches. Negative for weakness.  Hematological:  Negative for adenopathy.     Physical Exam Triage Vital Signs ED Triage Vitals  Encounter Vitals Group     BP 09/14/23 0809 (!) 144/84     Systolic BP Percentile --      Diastolic BP Percentile --      Pulse Rate 09/14/23 0809 83     Resp 09/14/23 0809 16     Temp 09/14/23 0809 98.5 F (36.9 C)     Temp Source 09/14/23 0809 Oral     SpO2 09/14/23 0809 97 %     Weight 09/14/23 0809 223 lb (101.2 kg)     Height 09/14/23 0809 5\' 2"  (1.575 m)     Head Circumference --      Peak Flow --      Pain Score 09/14/23 0808 8     Pain Loc --      Pain Education --      Exclude from Growth Chart --    No data found.  Updated Vital Signs BP (!) 144/84 (BP Location: Right Arm)   Pulse 83   Temp 98.5 F (36.9 C) (Oral)   Resp 16   Ht 5\' 2"  (1.575 m)   Wt 223 lb (101.2 kg)   LMP  (LMP Unknown)   SpO2 97%   BMI 40.79 kg/m     Physical Exam Vitals reviewed.  Constitutional:      General: She is not in acute distress.    Appearance: She is well-developed. She is not diaphoretic.  HENT:     Head: Normocephalic.     Right Ear: Tympanic membrane, ear canal and external ear normal.     Left Ear: Tympanic membrane, ear canal and external ear normal.     Nose: Congestion present.     Mouth/Throat:     Mouth: Mucous membranes are normal.     Pharynx: Posterior oropharyngeal erythema present.  Eyes:     General: No scleral icterus.       Right eye: No discharge.        Left eye: No discharge.     Extraocular Movements: EOM normal.     Conjunctiva/sclera: Conjunctivae normal.     Pupils: Pupils are equal, round, and reactive to light.  Neck:     Thyroid: No thyromegaly.     Vascular: No JVD.     Trachea: No tracheal deviation.  Cardiovascular:      Rate and Rhythm: Normal rate and regular rhythm.     Heart sounds: Normal heart  sounds. No murmur heard. Pulmonary:     Effort: Pulmonary effort is normal. No respiratory distress.     Breath sounds: Normal breath sounds. No stridor. No wheezing or rales.  Chest:     Chest wall: No tenderness.  Abdominal:     General: Bowel sounds are normal. There is no distension.     Palpations: Abdomen is soft. There is no mass.     Tenderness: There is no abdominal tenderness. There is no guarding or rebound.  Musculoskeletal:        General: No tenderness or edema.     Cervical back: Normal range of motion and neck supple.  Lymphadenopathy:     Cervical: No cervical adenopathy.  Skin:    General: Skin is warm and dry.     Coloration: Skin is not pale.     Findings: No erythema or rash.  Neurological:     Mental Status: She is alert and oriented to person, place, and time.     Deep Tendon Reflexes: Reflexes are normal and symmetric.  Psychiatric:        Mood and Affect: Mood and affect normal.        Behavior: Behavior normal.        Thought Content: Thought content normal.        Judgment: Judgment normal.      UC Treatments / Results  Labs (all labs ordered are listed, but only abnormal results are displayed) Labs Reviewed  GROUP A STREP BY PCR  SARS CORONAVIRUS 2 BY RT PCR    EKG   Radiology No results found.  Procedures Procedures (including critical care time)  Medications Ordered in UC Medications - No data to display  Initial Impression / Assessment and Plan / UC Course  I have reviewed the triage vital signs and the nursing notes.  Pertinent labs & imaging results that were available during my care of the patient were reviewed by me and considered in my medical decision making (see chart for details).   27 year old female presenting for 2-day history of fatigue, sore throat, cough and congestion.  Denies fever or shortness of breath.  She is afebrile.   Overall well-appearing.  No acute distress.  On exam has mild nasal congestion, mild posterior pharyngeal erythema.  Chest clear.  Strep and COVID testing obtained.  Negative testing.  Discussed results with patient.  Viral illness. Supportive care encouraged increasing rest and fluids.  Sent Promethazine DM and Atrovent nasal spray to pharmacy.  Work note given.  Reviewed return precautions.   Final Clinical Impressions(s) / UC Diagnoses   Final diagnoses:  Viral upper respiratory tract infection  Acute cough  Nasal congestion  Sore throat     Discharge Instructions      URI/COLD SYMPTOMS: Your exam today is consistent with a viral illness. Antibiotics are not indicated at this time. Use medications as directed, including cough syrup, nasal saline, and decongestants. Your symptoms should improve over the next few days and resolve within 7-10 days. Increase rest and fluids. F/u if symptoms worsen or predominate such as sore throat, ear pain, productive cough, shortness of breath, or if you develop high fevers or worsening fatigue over the next several days.       ED Prescriptions     Medication Sig Dispense Auth. Provider   promethazine-dextromethorphan (PROMETHAZINE-DM) 6.25-15 MG/5ML syrup Take 5 mLs by mouth 4 (four) times daily as needed. 118 mL Eusebio Friendly B, PA-C   ipratropium (ATROVENT) 0.06 % nasal  spray Place 2 sprays into both nostrils 4 (four) times daily. 15 mL Shirlee Latch, PA-C      PDMP not reviewed this encounter.   Shirlee Latch, PA-C 09/14/23 [phone]

## 2023-09-14 NOTE — Discharge Instructions (Signed)
URI/COLD SYMPTOMS: Your exam today is consistent with a viral illness. Antibiotics are not indicated at this time. Use medications as directed, including cough syrup, nasal saline, and decongestants. Your symptoms should improve over the next few days and resolve within 7-10 days. Increase rest and fluids. F/u if symptoms worsen or predominate such as sore throat, ear pain, productive cough, shortness of breath, or if you develop high fevers or worsening fatigue over the next several days.    

## 2023-11-09 ENCOUNTER — Ambulatory Visit
Admission: EM | Admit: 2023-11-09 | Discharge: 2023-11-09 | Disposition: A | Discharge status: Home / Self Care | Payer: 59 | Attending: Emergency Medicine | Admitting: Emergency Medicine

## 2023-11-09 DIAGNOSIS — M25532 Pain in left wrist: Secondary | ICD-10-CM

## 2023-11-09 MED ORDER — PREDNISONE 10 MG (21) PO TBPK: ORAL_TABLET | Freq: Every day | ORAL | 0 refills | Status: DC

## 2023-11-09 MED ORDER — KETOROLAC TROMETHAMINE 30 MG/ML IJ SOLN
30.0000 mg | Freq: Once | INTRAMUSCULAR | Status: AC
  Administered 2023-11-09: 30 mg via INTRAMUSCULAR

## 2023-11-09 NOTE — Discharge Instructions (Addendum)
Today you are evaluated for your wrist pain which is most likely irritation to the tendon or ligament that allow for movement, as there was no direct injury to the wrist the bone should be intact and therefore we have held off on x-ray imaging today, can be done if symptoms do not improve  You have been given an injection of Toradol which helps to reduce inflammation and pain and ideally will see improvement within the hour  Starting tomorrow take prednisone every morning with food as directed to continue the process above, may take Tylenol or any topical medicines additionally  May continue use of wrist brace as needed for stability and support  May use heat over the affected area 10 to 15-minute intervals  May rest wrist onto pillows whenever sitting and lying  If symptoms do not improve you may follow-up with orthopedic specialist whose information is on front page

## 2023-11-09 NOTE — ED Triage Notes (Signed)
Pt presents to UC c/o LT wrist pain x2 weeks, pt denies any known injury.

## 2023-11-09 NOTE — ED Provider Notes (Signed)
[facility]    CSN: [id number] Arrival date & time: 11/09/23  1327      History   Chief Complaint Chief Complaint  Patient presents with   Wrist Pain    HPI Kathy Romero is a 27 y.o. female.   Patient presents for evaluation of left-sided wrist pain present for 2 weeks.  Symptoms worsen over the past 7 days and become constant and prominent.  Pain is primarily along the radial aspect radiating into the right thumb, symptoms exacerbated by thumb movements.  Associated numbness to the dorsum aspect of the hand.  Denies injury or trauma.  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  Has attempted use of a wrist brace which has been helpful.  Denies injury or trauma.  Past Medical History:  Diagnosis Date   Anxiety    ASCUS with positive high risk HPV cervical 2018   Hemorrhoids 2019   Vitamin D deficiency     Patient Active Problem List   Diagnosis Date Noted   Trichomoniasis 04/01/2020   ASCUS with positive high risk HPV cervical 01/08/2019   Breakthrough bleeding on Nexplanon 10/11/2018    Past Surgical History:  Procedure Laterality Date   NO PAST SURGERIES      OB History     Gravida  0   Para  0   Term  0   Preterm  0   AB  0   Living  0      SAB  0   IAB  0   Ectopic  0   Multiple  0   Live Births  0            Home Medications    Prior to Admission medications   Medication Sig Start Date End Date Taking? Authorizing Provider  citalopram (CELEXA) 20 MG tablet Take 20 mg by mouth daily. 08/31/23 08/30/24  [provider]  etonogestrel (IMPLANON) 68 MG IMPL implant 1 each (68 mg total) by Subdermal route once for 1 dose. 05/29/19 07/08/21  Copland, Ilona Sorrel, PA-C  fluticasone (VERAMYST) 27.5 MCG/SPRAY nasal spray Place 1 spray into the nose. 01/23/23 01/23/24  [provider]  ipratropium (ATROVENT) 0.06 % nasal spray Place 2 sprays into both nostrils 4 (four) times daily. 09/14/23    Shirlee Latch, PA-C  promethazine (PHENERGAN) 25 MG tablet Take 25 mg by mouth every 6 (six) hours as needed. 08/31/23   [provider]  promethazine-dextromethorphan (PROMETHAZINE-DM) 6.25-15 MG/5ML syrup Take 5 mLs by mouth 4 (four) times daily as needed. 09/14/23   Shirlee Latch, PA-C  traZODone (DESYREL) 50 MG tablet Take by mouth. 08/23/21 08/23/22  [provider]  Vitamin D, Ergocalciferol, (DRISDOL) 1.25 MG (50000 UNIT) CAPS capsule Take by mouth. 02/08/21   [provider]  clonazePAM (KLONOPIN) 1 MG tablet 1/2 to 1 po q 12 hours prn 03/01/19 10/11/19  [provider]  fluticasone (FLONASE) 50 MCG/ACT nasal spray Place 2 sprays into both nostrils daily. 09/04/18 03/05/20  Lutricia Feil, PA-C  topiramate (TOPAMAX) 25 MG tablet Take 25 mg by mouth daily. 12/18/20 03/31/21  [provider]    Family History Family History  Problem Relation Age of Onset   Hypertension Mother    Diabetes Mother    Hypertension Father    Diabetes Father    Colon cancer Other    Diabetes Maternal Grandmother    Hypertension Maternal Grandmother    Stroke Maternal Grandmother  Breast cancer Maternal Grandmother 45   Diabetes Maternal Grandfather     Social History Social History   Tobacco Use   Smoking status: Former    Current packs/day: 0.25    Average packs/day: 0.3 packs/day for 3.0 years (0.8 ttl pk-yrs)    Types: Cigarettes   Smokeless tobacco: Never  Vaping Use   Vaping status: Some Days  Substance Use Topics   Alcohol use: Yes   Drug use: No     Allergies   Sulfamethoxazole-trimethoprim   Review of Systems Review of Systems   Physical Exam Triage Vital Signs ED Triage Vitals [11/09/23 1432]  Encounter Vitals Group     BP 131/84     Systolic BP Percentile      Diastolic BP Percentile      Pulse Rate (!) 54     Resp      Temp 98.5 F (36.9 C)     Temp Source Oral     SpO2 97 %     Weight      Height      Head  Circumference      Peak Flow      Pain Score 7     Pain Loc      Pain Education      Exclude from Growth Chart    No data found.  Updated Vital Signs BP 131/84 (BP Location: Right Arm)   Pulse (!) 54   Temp 98.5 F (36.9 C) (Oral)   SpO2 97%   Visual Acuity Right Eye Distance:   Left Eye Distance:   Bilateral Distance:    Right Eye Near:   Left Eye Near:    Bilateral Near:     Physical Exam Constitutional:      Appearance: Normal appearance.  Eyes:     Extraocular Movements: Extraocular movements intact.  Pulmonary:     Effort: Pulmonary effort is normal.  Musculoskeletal:     Comments: Tenderness present to the radial aspect of the left wrist without swelling or deformity, no ecchymosis present, able to complete range of motion but pain is elicited with flexion and extension of the left thumb.,  2+ radial pulse, sensation intact  Neurological:     Mental Status: She is alert and oriented to person, place, and time. Mental status is at baseline.      UC Treatments / Results  Labs (all labs ordered are listed, but only abnormal results are displayed) Labs Reviewed - No data to display  EKG   Radiology No results found.  Procedures Procedures (including critical care time)  Medications Ordered in UC Medications - No data to display  Initial Impression / Assessment and Plan / UC Course  I have reviewed the triage vital signs and the nursing notes.  Pertinent labs & imaging results that were available during my care of the patient were reviewed by me and considered in my medical decision making (see chart for details).  Acute left wrist pain  X-ray deferred due to lack of injury, discussed this with patient, Toradol IM given and prescribed prednisone for home use recommended RICE, heat massage stretching with activity as tolerated, may continue wrist brace as needed, walking referral given to orthopedics if symptoms continue to persist Final Clinical  Impressions(s) / UC Diagnoses   Final diagnoses:  None   Discharge Instructions   None    ED Prescriptions   None    PDMP not reviewed this encounter.   Valinda Hoar, NP 11/09/23  1544  

## 2023-12-05 ENCOUNTER — Ambulatory Visit
Admission: EM | Admit: 2023-12-05 | Discharge: 2023-12-05 | Disposition: A | Discharge status: Home / Self Care | Payer: 59

## 2023-12-05 ENCOUNTER — Encounter: Payer: Self-pay | Admitting: Emergency Medicine

## 2023-12-05 DIAGNOSIS — M546 Pain in thoracic spine: Secondary | ICD-10-CM | POA: Diagnosis not present

## 2023-12-05 MED ORDER — IBUPROFEN 600 MG PO TABS: 600.0000 mg | ORAL_TABLET | Freq: Four times a day (QID) | ORAL | 0 refills | Status: AC | PRN

## 2023-12-05 MED ORDER — BACLOFEN 10 MG PO TABS: 10.0000 mg | ORAL_TABLET | Freq: Three times a day (TID) | ORAL | 0 refills | Status: DC

## 2023-12-05 NOTE — Discharge Instructions (Signed)

## 2023-12-05 NOTE — ED Provider Notes (Signed)
 [facility]    CSN: [id number] Arrival date & time: 12/05/23  1333      History   Chief Complaint Chief Complaint  Patient presents with   Back Pain    HPI Kathy Romero is a 28 y.o. female.   HPI  28 year old female with a past medical history significant for vitamin D deficiency and anxiety presents for evaluation of right sided thoracic back pain that started this morning when she woke up.  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  She reports that as she is going through the day the pain has intensified.  It is worse with movement of her back as well as standing.  It does not radiate into her lower legs.  She denies any numbness, tingling, or weakness in any of her extremities.  She also denies urinary symptoms.  Past Medical History:  Diagnosis Date   Anxiety    ASCUS with positive high risk HPV cervical 2018   Hemorrhoids 2019   Vitamin D deficiency     Patient Active Problem List   Diagnosis Date Noted   Trichomoniasis 04/01/2020   ASCUS with positive high risk HPV cervical 01/08/2019   Breakthrough bleeding on Nexplanon  10/11/2018    Past Surgical History:  Procedure Laterality Date   NO PAST SURGERIES      OB History     Gravida  0   Para  0   Term  0   Preterm  0   AB  0   Living  0      SAB  0   IAB  0   Ectopic  0   Multiple  0   Live Births  0            Home Medications    Prior to Admission medications   Medication Sig Start Date End Date Taking? Authorizing Provider  baclofen  (LIORESAL ) 10 MG tablet Take 1 tablet (10 mg total) by mouth 3 (three) times daily. 12/05/23  Yes Bernardino Ditch, NP  cetirizine (ZYRTEC) 10 MG tablet Take 1 tablet by mouth daily. 01/23/23 01/23/24 Yes [provider]  Cholecalciferol (VITAMIN D-1000 MAX ST) 25 MCG (1000 UT) tablet Take by mouth. 01/25/23 01/26/24 Yes [provider]  ibuprofen  (ADVIL ) 600 MG tablet Take 1 tablet (600 mg total) by mouth every  6 (six) hours as needed. 12/05/23  Yes Bernardino Ditch, NP  citalopram (CELEXA) 20 MG tablet Take 20 mg by mouth daily. 08/31/23 08/30/24  [provider]  etonogestrel  (IMPLANON ) 68 MG IMPL implant 1 each (68 mg total) by Subdermal route once for 1 dose. 05/29/19 07/08/21  Copland, Alicia B, PA-C  fluticasone  (VERAMYST) 27.5 MCG/SPRAY nasal spray Place 1 spray into the nose. 01/23/23 01/23/24  [provider]  ipratropium (ATROVENT ) 0.06 % nasal spray Place 2 sprays into both nostrils 4 (four) times daily. 09/14/23   Arvis Jolan NOVAK, PA-C  predniSONE  (STERAPRED UNI-PAK 21 TAB) 10 MG (21) TBPK tablet Take by mouth daily. Take 6 tabs by mouth daily  for 1 days, then 5 tabs for 1 days, then 4 tabs for 1 days, then 3 tabs for 1 days, 2 tabs for 1 days, then 1 tab by mouth daily for 1 days 11/09/23   Teresa Shelba SAUNDERS, NP  promethazine  (PHENERGAN ) 25 MG tablet Take 25 mg by mouth every 6 (six) hours as needed. 08/31/23   [provider]  promethazine -dextromethorphan (PROMETHAZINE -DM) 6.25-15 MG/5ML syrup Take 5  mLs by mouth 4 (four) times daily as needed. 09/14/23   Arvis Jolan NOVAK, PA-C  traZODone (DESYREL) 50 MG tablet Take by mouth. 08/23/21 08/23/22  [provider]  Vitamin D, Ergocalciferol, (DRISDOL) 1.25 MG (50000 UNIT) CAPS capsule Take by mouth. 02/08/21   [provider]  clonazePAM (KLONOPIN) 1 MG tablet 1/2 to 1 po q 12 hours prn 03/01/19 10/11/19  [provider]  fluticasone  (FLONASE ) 50 MCG/ACT nasal spray Place 2 sprays into both nostrils daily. 09/04/18 03/05/20  Lacinda Elsie SQUIBB, PA-C  topiramate (TOPAMAX) 25 MG tablet Take 25 mg by mouth daily. 12/18/20 03/31/21  [provider]    Family History Family History  Problem Relation Age of Onset   Hypertension Mother    Diabetes Mother    Hypertension Father    Diabetes Father    Colon cancer Other    Diabetes Maternal Grandmother    Hypertension Maternal Grandmother    Stroke Maternal  Grandmother    Breast cancer Maternal Grandmother 23   Diabetes Maternal Grandfather     Social History Social History   Tobacco Use   Smoking status: Former    Current packs/day: 0.25    Average packs/day: 0.3 packs/day for 3.0 years (0.8 ttl pk-yrs)    Types: Cigarettes   Smokeless tobacco: Never  Vaping Use   Vaping status: Some Days  Substance Use Topics   Alcohol use: Yes    Comment: social   Drug use: Yes     Allergies   Sulfamethoxazole-trimethoprim   Review of Systems Review of Systems  Genitourinary:  Negative for dysuria, frequency and urgency.  Musculoskeletal:  Positive for back pain.  Neurological:  Negative for weakness and numbness.     Physical Exam Triage Vital Signs ED Triage Vitals [12/05/23 1404]  Encounter Vitals Group     BP 131/76     Systolic BP Percentile      Diastolic BP Percentile      Pulse Rate 80     Resp 18     Temp 99 F (37.2 C)     Temp Source Oral     SpO2 99 %     Weight      Height      Head Circumference      Peak Flow      Pain Score 7     Pain Loc      Pain Education      Exclude from Growth Chart    No data found.  Updated Vital Signs BP 131/76   Pulse 80   Temp 99 F (37.2 C) (Oral)   Resp 18   LMP  (LMP Unknown)   SpO2 99%   Visual Acuity Right Eye Distance:   Left Eye Distance:   Bilateral Distance:    Right Eye Near:   Left Eye Near:    Bilateral Near:     Physical Exam Vitals and nursing note reviewed.  Constitutional:      Appearance: Normal appearance.  Abdominal:     Tenderness: There is no right CVA tenderness or left CVA tenderness.  Musculoskeletal:        General: Tenderness present. No signs of injury.  Skin:    General: Skin is warm and dry.     Capillary Refill: Capillary refill takes less than 2 seconds.  Neurological:     General: No focal deficit present.     Mental Status: She is alert and oriented to person, place, and time.  UC Treatments / Results   Labs (all labs ordered are listed, but only abnormal results are displayed) Labs Reviewed - No data to display  EKG   Radiology No results found.  Procedures Procedures (including critical care time)  Medications Ordered in UC Medications - No data to display  Initial Impression / Assessment and Plan / UC Course  I have reviewed the triage vital signs and the nursing notes.  Pertinent labs & imaging results that were available during my care of the patient were reviewed by me and considered in my medical decision making (see chart for details).   Patient is a pleasant, nontoxic-appearing 28 year old female presenting for evaluation of right-sided thoracic back pain as outlined in HPI above.  On exam she has no midline spinous process tenderness or step-off in her thoracic or lumbar spine.  She does have some mild tenderness and muscle tension to palpation in the right mid thoracic paraspinous region.  Left is negative.  This pain increases with lateral rotation, lateral flexion, forward flexion, and extension of her back.  She has no CVA tenderness on exam.  I do not suspect urinary involvement given her lack of UTI symptoms and lack of CVA tenderness.  Her exam is most consistent with musculoskeletal thoracic back pain.  I offer the patient an injection of Toradol  here in clinic but she declined.  I will discharge her home on ibuprofen  600 mg every 6 hours with food combined with baclofen  10 mg every 8 hours.  I will have her take both this medications on a schedule for the next 48 hours and then back off to an as-needed basis.  Home physical therapy exercises also provided.  Follow-up instructions offered and work note provided.   Final Clinical Impressions(s) / UC Diagnoses   Final diagnoses:  Acute right-sided thoracic back pain     Discharge Instructions      Take the ibuprofen , 600 mg every 6 hours with food, on a schedule for the next 48 hours and then as needed.  Take  the baclofen , 10 mg every 8 hours, on a schedule for the next 48 hours and then as needed.  Apply moist heat to your back for 30 minutes at a time 2-3 times a day to improve blood flow to the area and help remove the lactic acid causing the spasm.  Follow the back exercises given at discharge.  Return for reevaluation for any new or worsening symptoms.      ED Prescriptions     Medication Sig Dispense Auth. Provider   baclofen  (LIORESAL ) 10 MG tablet Take 1 tablet (10 mg total) by mouth 3 (three) times daily. 30 each Bernardino Ditch, NP   ibuprofen  (ADVIL ) 600 MG tablet Take 1 tablet (600 mg total) by mouth every 6 (six) hours as needed. 30 tablet Bernardino Ditch, NP      PDMP not reviewed this encounter.   Bernardino Ditch, NP 12/05/23 [phone]

## 2023-12-05 NOTE — ED Triage Notes (Signed)
 Patient states she woke up with right sided low back pain. Denies urinary symptoms or recent trauma.

## 2024-09-10 ENCOUNTER — Encounter: Payer: Self-pay | Admitting: *Deleted

## 2024-09-10 ENCOUNTER — Ambulatory Visit
Admission: EM | Admit: 2024-09-10 | Discharge: 2024-09-10 | Disposition: A | Discharge status: Home / Self Care | Attending: Family Medicine | Admitting: Family Medicine

## 2024-09-10 DIAGNOSIS — M25512 Pain in left shoulder: Secondary | ICD-10-CM | POA: Diagnosis not present

## 2024-09-10 MED ORDER — CYCLOBENZAPRINE HCL 5 MG PO TABS: 5.0000 mg | ORAL_TABLET | Freq: Three times a day (TID) | ORAL | 0 refills | Status: AC | PRN

## 2024-09-10 MED ORDER — PREDNISONE 10 MG (21) PO TBPK: ORAL_TABLET | Freq: Every day | ORAL | 0 refills | Status: AC

## 2024-09-10 NOTE — Discharge Instructions (Addendum)
 If medication was prescribed, stop by the pharmacy to pick up your prescriptions.  For your  pain, Take 1000 mg Tylenol   3-4 times a day, take muscle relaxer,  as needed for pain. Rest and elevate the affected painful area.  Apply cold compresses intermittently, as needed.  As pain recedes, begin normal activities slowly as tolerated.  Follow up with primary care provider or an orthopedic provider, if symptoms persist.  Watch for worsening symptoms such as an increasing weakness or loss of sensation, increasing pain and/or the loss of bladder or bowel function. Should any of these occur, go to the emergency department immediately.

## 2024-09-10 NOTE — ED Triage Notes (Signed)
 Pt states she woke up Saturday morning with left shoulder pain that is worse when she moves. No pain when sitting still. She denies any injury or known cause. She has taken IBU as needed last dose was at 2am.

## 2024-09-10 NOTE — ED Provider Notes (Signed)
 [facility]    CSN: [id number] Arrival date & time: 09/10/24  1259      History   Chief Complaint Chief Complaint  Patient presents with   Shoulder Pain    HPI  HPI BRANDIN DILDAY is a 28 y.o. female.   Lovelyn presents for 3 days of left shoulder pain. Has neck pain and soreness whenever she moves her arm around.  Pain described as tightness. Her left hand feels somewhat numb.  Took ibuprofen  600 mg this morning around 2 AM. She went to work Friday night and went to sleep then woke up Saturday with pain. 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 Pain is getting worse.  She has never had pain like this in her arm before. She has history of carpal tunnel in her her right side.   She is right handed.     Past Medical History:  Diagnosis Date   Anxiety    ASCUS with positive high risk HPV cervical 2018   Hemorrhoids 2019   Vitamin D deficiency     Patient Active Problem List   Diagnosis Date Noted   Trichomoniasis 04/01/2020   ASCUS with positive high risk HPV cervical 01/08/2019   Breakthrough bleeding on Nexplanon  10/11/2018    Past Surgical History:  Procedure Laterality Date   NO PAST SURGERIES      OB History     Gravida  0   Para  0   Term  0   Preterm  0   AB  0   Living  0      SAB  0   IAB  0   Ectopic  0   Multiple  0   Live Births  0            Home Medications    Prior to Admission medications   Medication Sig Start Date End Date Taking? Authorizing Provider  cyclobenzaprine  (FLEXERIL ) 5 MG tablet Take 1 tablet (5 mg total) by mouth 3 (three) times daily as needed. 09/10/24  Yes Lovette Merta, DO  predniSONE  (STERAPRED UNI-PAK 21 TAB) 10 MG (21) TBPK tablet Take by mouth daily. Take 6 tabs by mouth daily for 1, then 5 tabs for 1 day, then 4 tabs for 1 day, then 3 tabs for 1 day, then 2 tabs for 1 day, then 1 tab for 1 day. 09/10/24  Yes Miaa Latterell, DO  cetirizine (ZYRTEC) 10 MG  tablet Take 1 tablet by mouth daily. 01/23/23 01/23/24  [provider]  citalopram (CELEXA) 20 MG tablet Take 20 mg by mouth daily. 08/31/23 08/30/24  [provider]  etonogestrel  (IMPLANON ) 68 MG IMPL implant 1 each (68 mg total) by Subdermal route once for 1 dose. 05/29/19 07/08/21  Copland, Alicia B, PA-C  fluticasone  (VERAMYST) 27.5 MCG/SPRAY nasal spray Place 1 spray into the nose. 01/23/23 01/23/24  [provider]  ibuprofen  (ADVIL ) 600 MG tablet Take 1 tablet (600 mg total) by mouth every 6 (six) hours as needed. 12/05/23   Bernardino Ditch, NP  promethazine  (PHENERGAN ) 25 MG tablet Take 25 mg by mouth every 6 (six) hours as needed. 08/31/23   [provider]  traZODone (DESYREL) 50 MG tablet Take by mouth. 08/23/21 08/23/22  [provider]  Vitamin D, Ergocalciferol, (DRISDOL) 1.25 MG (50000 UNIT) CAPS capsule Take by mouth. 02/08/21   [provider]  clonazePAM (KLONOPIN) 1 MG tablet 1/2 to 1 po q 12 hours prn 03/01/19  10/11/19  [provider]  fluticasone  (FLONASE ) 50 MCG/ACT nasal spray Place 2 sprays into both nostrils daily. 09/04/18 03/05/20  Lacinda Elsie SQUIBB, PA-C  topiramate (TOPAMAX) 25 MG tablet Take 25 mg by mouth daily. 12/18/20 03/31/21  [provider]    Family History Family History  Problem Relation Age of Onset   Hypertension Mother    Diabetes Mother    Hypertension Father    Diabetes Father    Colon cancer Other    Diabetes Maternal Grandmother    Hypertension Maternal Grandmother    Stroke Maternal Grandmother    Breast cancer Maternal Grandmother 75   Diabetes Maternal Grandfather     Social History Social History   Tobacco Use   Smoking status: Former    Current packs/day: 0.25    Average packs/day: 0.3 packs/day for 3.0 years (0.8 ttl pk-yrs)    Types: Cigarettes   Smokeless tobacco: Never  Vaping Use   Vaping status: Some Days  Substance Use Topics   Alcohol use: Yes    Comment: social    Drug use: Yes     Allergies   Sulfamethoxazole-trimethoprim   Review of Systems Review of Systems: :negative unless otherwise stated in HPI.      Physical Exam Triage Vital Signs ED Triage Vitals  Encounter Vitals Group     BP 09/10/24 1405 135/89     Girls Systolic BP Percentile --      Girls Diastolic BP Percentile --      Boys Systolic BP Percentile --      Boys Diastolic BP Percentile --      Pulse Rate 09/10/24 1405 71     Resp 09/10/24 1405 16     Temp 09/10/24 1405 98.4 F (36.9 C)     Temp Source 09/10/24 1405 Oral     SpO2 09/10/24 1405 98 %     Weight --      Height --      Head Circumference --      Peak Flow --      Pain Score 09/10/24 1404 7     Pain Loc --      Pain Education --      Exclude from Growth Chart --    No data found.  Updated Vital Signs BP 135/89 (BP Location: Right Arm)   Pulse 71   Temp 98.4 F (36.9 C) (Oral)   Resp 16   LMP 08/27/2024 (Approximate)   SpO2 98%   Visual Acuity Right Eye Distance:   Left Eye Distance:   Bilateral Distance:    Right Eye Near:   Left Eye Near:    Bilateral Near:     Physical Exam GEN: well appearing female in no acute distress  NECK:  no midline TTP, +left paraspinal and trapezius TTP, no overlying skin changes  CVS: well perfused, regular rate and rhythm  RESP: speaking in full sentences without pause, no respiratory distress  MSK:   Left  shoulder:  No evidence of bony deformity, asymmetry, or muscle atrophy. No tenderness over long head of biceps (bicipital groove).  No TTP at [facility] joint.  Limited abduction, adduction 2/2 to pain. Full passive ROM Strength 5/5 grip, elbow and shoulder. No abnormal scapular function observed.  Special Tests: Hawkins: positive; Empty Can: negative, Neer's: Negative; Painful arc: Negative; Anterior Apprehension: Negative Sensation intact. Peripheral pulses intact.   UC Treatments / Results  Labs (all labs ordered are listed, but only abnormal results  are displayed)  Labs Reviewed - No data to display  EKG   Radiology No results found.    Procedures Procedures (including critical care time)  Medications Ordered in UC Medications - No data to display  Initial Impression / Assessment and Plan / UC Course  I have reviewed the triage vital signs and the nursing notes.  Pertinent labs & imaging results that were available during my care of the patient were reviewed by me and considered in my medical decision making (see chart for details).      Pt is a 28 y.o.  female with 3 days of left neck and shoulder pain.   On exam, pt has left cervical and posterior shoulder muscle tenderness to palpation concerning for muscle strain vs cervical radiculopathy.  Imaging deferred.  Patient to gradually return to normal activities, as tolerated and continue ordinary activities within the limits permitted by pain. Prescribed prednisone  taper and muscle relaxer  for pain relief.  Tylenol  PRN. Advised patient to avoid OTC NSAIDs while taking prescription NSAID. Counseled patient on red flag symptoms and when to seek immediate care.  No red flags such as progressive major motor weakness.   Patient to follow up with orthopedic provider, if symptoms do not improve with conservative treatment.  Return and ED precautions given. Understanding voiced. Discussed MDM, treatment plan and plan for follow-up with patient who agrees with plan.   Final Clinical Impressions(s) / UC Diagnoses   Final diagnoses:  Acute pain of left shoulder     Discharge Instructions      If medication was prescribed, stop by the pharmacy to pick up your prescriptions.  For your  pain, Take 1000 mg Tylenol   3-4 times a day, take muscle relaxer,  as needed for pain. Rest and elevate the affected painful area.  Apply cold compresses intermittently, as needed.  As pain recedes, begin normal activities slowly as tolerated.  Follow up with primary care provider or an orthopedic  provider, if symptoms persist.  Watch for worsening symptoms such as an increasing weakness or loss of sensation, increasing pain and/or the loss of bladder or bowel function. Should any of these occur, go to the emergency department immediately.         ED Prescriptions     Medication Sig Dispense Auth. Provider   cyclobenzaprine  (FLEXERIL ) 5 MG tablet Take 1 tablet (5 mg total) by mouth 3 (three) times daily as needed. 30 tablet Chancy Claros, DO   predniSONE  (STERAPRED UNI-PAK 21 TAB) 10 MG (21) TBPK tablet Take by mouth daily. Take 6 tabs by mouth daily for 1, then 5 tabs for 1 day, then 4 tabs for 1 day, then 3 tabs for 1 day, then 2 tabs for 1 day, then 1 tab for 1 day. 21 tablet Caeleb Batalla, DO      PDMP not reviewed this encounter.   Less Woolsey, DO 09/18/24 1224
